# Patient Record
Sex: Female | Born: 1955 | Race: Black or African American | Hispanic: No | Marital: Married | Smoking: Never smoker
Health system: Southern US, Community
[De-identification: ages and names within clinical notes are randomized; demographics above are authoritative.]

## PROBLEM LIST (undated history)

## (undated) DIAGNOSIS — I10 Essential (primary) hypertension: Secondary | ICD-10-CM

## (undated) DIAGNOSIS — Z86010 Personal history of colonic polyps: Secondary | ICD-10-CM

## (undated) DIAGNOSIS — T8859XA Other complications of anesthesia, initial encounter: Secondary | ICD-10-CM

## (undated) DIAGNOSIS — E119 Type 2 diabetes mellitus without complications: Secondary | ICD-10-CM

## (undated) DIAGNOSIS — R202 Paresthesia of skin: Secondary | ICD-10-CM

## (undated) DIAGNOSIS — C50919 Malignant neoplasm of unspecified site of unspecified female breast: Secondary | ICD-10-CM

## (undated) DIAGNOSIS — E1121 Type 2 diabetes mellitus with diabetic nephropathy: Secondary | ICD-10-CM

## (undated) DIAGNOSIS — N189 Chronic kidney disease, unspecified: Secondary | ICD-10-CM

## (undated) DIAGNOSIS — E876 Hypokalemia: Secondary | ICD-10-CM

## (undated) DIAGNOSIS — M109 Gout, unspecified: Secondary | ICD-10-CM

## (undated) DIAGNOSIS — C801 Malignant (primary) neoplasm, unspecified: Secondary | ICD-10-CM

## (undated) DIAGNOSIS — T4145XA Adverse effect of unspecified anesthetic, initial encounter: Secondary | ICD-10-CM

## (undated) DIAGNOSIS — Z860101 Personal history of adenomatous and serrated colon polyps: Secondary | ICD-10-CM

## (undated) DIAGNOSIS — E785 Hyperlipidemia, unspecified: Secondary | ICD-10-CM

## (undated) HISTORY — DX: Essential (primary) hypertension: I10

## (undated) HISTORY — DX: Personal history of adenomatous and serrated colon polyps: Z86.0101

## (undated) HISTORY — DX: Type 2 diabetes mellitus with diabetic nephropathy: E11.21

## (undated) HISTORY — DX: Personal history of colonic polyps: Z86.010

## (undated) HISTORY — PX: ABDOMINAL HYSTERECTOMY: SHX81

## (undated) HISTORY — DX: Gout, unspecified: M10.9

## (undated) HISTORY — DX: Type 2 diabetes mellitus without complications: E11.9

## (undated) HISTORY — DX: Malignant (primary) neoplasm, unspecified: C80.1

## (undated) HISTORY — PX: BREAST EXCISIONAL BIOPSY: SUR124

## (undated) HISTORY — DX: Hyperlipidemia, unspecified: E78.5

## (undated) HISTORY — DX: Paresthesia of skin: R20.2

---

## 1993-10-09 DIAGNOSIS — C801 Malignant (primary) neoplasm, unspecified: Secondary | ICD-10-CM

## 1993-10-09 HISTORY — PX: BREAST SURGERY: SHX581

## 1993-10-09 HISTORY — DX: Malignant (primary) neoplasm, unspecified: C80.1

## 1998-01-05 ENCOUNTER — Ambulatory Visit (HOSPITAL_COMMUNITY): Admission: RE | Admit: 1998-01-05 | Discharge: 1998-01-05 | Payer: Self-pay | Admitting: Geriatric Medicine

## 1999-01-03 ENCOUNTER — Ambulatory Visit (HOSPITAL_COMMUNITY): Admission: RE | Admit: 1999-01-03 | Discharge: 1999-01-03 | Payer: Self-pay | Admitting: Geriatric Medicine

## 1999-01-18 ENCOUNTER — Ambulatory Visit (HOSPITAL_COMMUNITY): Admission: RE | Admit: 1999-01-18 | Discharge: 1999-01-18 | Payer: Self-pay | Admitting: *Deleted

## 2000-02-08 ENCOUNTER — Encounter: Admission: RE | Admit: 2000-02-08 | Discharge: 2000-02-08 | Payer: Self-pay | Admitting: Neurological Surgery

## 2000-02-08 ENCOUNTER — Encounter: Payer: Self-pay | Admitting: Neurological Surgery

## 2000-10-09 DIAGNOSIS — C50919 Malignant neoplasm of unspecified site of unspecified female breast: Secondary | ICD-10-CM

## 2000-10-09 HISTORY — PX: MASTECTOMY: SHX3

## 2000-10-09 HISTORY — DX: Malignant neoplasm of unspecified site of unspecified female breast: C50.919

## 2001-03-12 ENCOUNTER — Encounter: Admission: RE | Admit: 2001-03-12 | Discharge: 2001-03-12 | Payer: Self-pay | Admitting: Geriatric Medicine

## 2001-03-12 ENCOUNTER — Encounter: Payer: Self-pay | Admitting: Geriatric Medicine

## 2001-03-27 ENCOUNTER — Ambulatory Visit (HOSPITAL_COMMUNITY): Admission: RE | Admit: 2001-03-27 | Discharge: 2001-03-27 | Payer: Self-pay | Admitting: Geriatric Medicine

## 2001-03-27 ENCOUNTER — Encounter: Payer: Self-pay | Admitting: Geriatric Medicine

## 2001-08-05 ENCOUNTER — Other Ambulatory Visit: Admission: RE | Admit: 2001-08-05 | Discharge: 2001-08-05 | Payer: Self-pay | Admitting: *Deleted

## 2001-08-27 ENCOUNTER — Ambulatory Visit: Admission: RE | Admit: 2001-08-27 | Discharge: 2001-08-27 | Payer: Self-pay | Admitting: Gynecology

## 2001-09-02 ENCOUNTER — Encounter: Payer: Self-pay | Admitting: Gynecology

## 2001-09-04 ENCOUNTER — Inpatient Hospital Stay (HOSPITAL_COMMUNITY): Admission: RE | Admit: 2001-09-04 | Discharge: 2001-09-10 | Payer: Self-pay | Admitting: *Deleted

## 2001-09-04 ENCOUNTER — Encounter (INDEPENDENT_AMBULATORY_CARE_PROVIDER_SITE_OTHER): Payer: Self-pay

## 2001-09-08 ENCOUNTER — Encounter: Payer: Self-pay | Admitting: *Deleted

## 2002-04-14 ENCOUNTER — Ambulatory Visit (HOSPITAL_COMMUNITY): Admission: RE | Admit: 2002-04-14 | Discharge: 2002-04-14 | Payer: Self-pay | Admitting: Geriatric Medicine

## 2002-04-14 ENCOUNTER — Encounter: Payer: Self-pay | Admitting: Geriatric Medicine

## 2002-10-21 ENCOUNTER — Other Ambulatory Visit: Admission: RE | Admit: 2002-10-21 | Discharge: 2002-10-21 | Payer: Self-pay | Admitting: Obstetrics & Gynecology

## 2003-11-05 ENCOUNTER — Ambulatory Visit (HOSPITAL_COMMUNITY): Admission: RE | Admit: 2003-11-05 | Discharge: 2003-11-05 | Payer: Self-pay | Admitting: Geriatric Medicine

## 2005-10-27 ENCOUNTER — Ambulatory Visit (HOSPITAL_COMMUNITY): Admission: RE | Admit: 2005-10-27 | Discharge: 2005-10-27 | Payer: Self-pay | Admitting: Urology

## 2005-11-15 ENCOUNTER — Encounter: Admission: RE | Admit: 2005-11-15 | Discharge: 2005-11-15 | Payer: Self-pay | Admitting: Geriatric Medicine

## 2006-02-07 ENCOUNTER — Encounter: Payer: Self-pay | Admitting: Geriatric Medicine

## 2007-03-11 ENCOUNTER — Ambulatory Visit (HOSPITAL_COMMUNITY): Admission: RE | Admit: 2007-03-11 | Discharge: 2007-03-11 | Payer: Self-pay | Admitting: Geriatric Medicine

## 2008-07-16 ENCOUNTER — Ambulatory Visit (HOSPITAL_COMMUNITY): Admission: RE | Admit: 2008-07-16 | Discharge: 2008-07-16 | Payer: Self-pay | Admitting: Geriatric Medicine

## 2009-08-04 ENCOUNTER — Ambulatory Visit (HOSPITAL_COMMUNITY): Admission: RE | Admit: 2009-08-04 | Discharge: 2009-08-04 | Payer: Self-pay | Admitting: Geriatric Medicine

## 2010-08-11 ENCOUNTER — Ambulatory Visit (HOSPITAL_COMMUNITY): Admission: RE | Admit: 2010-08-11 | Discharge: 2010-08-11 | Payer: Self-pay | Admitting: Geriatric Medicine

## 2011-02-24 NOTE — Discharge Summary (Signed)
[facility]  Patient:    Lindsay Quinn, Lindsay Quinn Visit Number: [id number] MRN: [id number]          Service Type: GYN Location: [place] 01 Attending Physician:  Alvino Chapel Dictated by:   Coralee Pesa Duanne Limerick, M.D. Admit Date:  09/04/2001 Discharge Date: 09/10/2001                             Discharge Summary  DISCHARGE DIAGNOSES:          1. Leiomyomata uteri--enlarging (submucosal,                                  intramural, and subserosal).                               2. Postoperative ileus.                               3. History of breast cancer.  OPERATIVE PROCEDURE:          1. Total abdominal hysterectomy by                                  Dr. Marti Sleigh, Dr. Micheal Likens                                  assistant.                               2. Exploratory laparotomy.  HISTORY OF PRESENT ILLNESS:   A 55 year old woman, with personal history of breast cancer, admitted for a total abdominal hysterectomy by Dr. Marti Sleigh for rapidly enlarging uterine fibroids.  Uterine size had changed from 8-10 weeks in 2001 to almost 20 weeks this year.  She has been asymptomatic.  She was admitted and taken to the operating room on September 04, 2001.  A total abdominal hysterectomy was performed by Dr. Marti Sleigh with Dr. Micheal Likens as assistant.  Final pathology revealed benign secretory endometrium, leiomyomata uteri (submucosal, intramural, and subserosal) associated with extensive degenerative change and cervix with squamous metaplasia.  Estimated blood loss was 150 cc.  The patients postoperative course was remarkable for a prolonged postoperative ileus, which occurred on postop day #2.  Several attempts at advancing diet were unsuccessful.  X-ray of abdomen revealed gas in the proximal and mid colon, a tiny amount of gas in the rectum, but air fluid levels in the loops of small bowel which were  mildly distended on September 08, 2001.  Potassium was repleted.  Intraoperative oversewing of a very small portion of small bowel serosa which had been blanched by cautery was discussed with the patient.  At no time did she appear to have a small bowel obstruction.  She remained n.p.o. from December 1 to December 3.  Diet was advanced without difficulty on postoperative day #6, December 3.  She was discharged home in satisfactory condition.  She was given routine status post laparotomy instructions.  She will return to the office for reevaluation in a week.  Staples were removed  prior to discharge.  DISCHARGE MEDICATIONS:        1. Hydrochlorothiazide as preop.                               2. Tylenol.                               3. Darvocet as needed.                               4. Vitamins.  Postoperative hemoglobin was 12.7. Dictated by:   Coralee Pesa Duanne Limerick, M.D. Attending Physician:  Alvino Chapel DD:  09/26/01 TD:  09/27/01 Job: [id number]

## 2011-02-24 NOTE — Discharge Summary (Signed)
NAMESHUNNA, BUSAM             ACCOUNT NO.:  [id number]   MEDICAL RECORD NO.:  [id number]          PATIENT TYPE:  INP   LOCATION:                                FACILITY:  [facility]   PHYSICIAN:  Thornell Sartorius, MD        DATE OF BIRTH:  [birth year]   DATE OF ADMISSION:  03/15/2007  DATE OF DISCHARGE:  03/16/2007                               DISCHARGE SUMMARY   PROCEDURE:  Total abdominal hysterectomy.   HISTORY:  This is a 55 year old African-American female G0, P0, who was  admitted for abdominal hysterectomy secondary to severe menorrhagia and  history of severe anemia.  Her LMP was the second week of May.  In the  past she has had a myomectomy for severe anemia to 3 and severe  menorrhagia. At her last office visit she said she had done well for 10  years, but in April of this year her hemoglobin was 5.6.  She was given  Lupron to see if we could build up her hemoglobin prior to her surgery.   PAST MEDICAL HISTORY:  Not significant other than for anemia.   PAST SURGICAL HISTORY:  Significant for myomectomy in 1997.   PAST OB/GYN HISTORY:  She is gravida 0 with limited care in the past  several years.  She has never had intercourse, has never been pregnant.   ALLERGIES:  No known drug allergies.   MEDICATIONS:  Iron.   SOCIAL HISTORY:  She denied tobacco or alcohol use.   FAMILY HISTORY:  Mother passed away with kidney problems.  Father is  healthy.   PHYSICAL EXAMINATION:  On admission she had a benign exam.   HOSPITAL COURSE:  She had an EBL of approximately 150 cc. There were no  complications.  Her postoperative course has been relatively  uncomplicated.  She has remained afebrile with stable vital signs  throughout.  She is able to walk, her pain is well controlled with p.o.  pain medicine and she is tolerating a diet.  Her hemoglobin decreased  from 10.7 to 9.3.  She was ready for discharge home on postoperative day  2 with routine discharge instructions and a  prescription for Motrin and  Percocet.   She will follow up on Monday or Tuesday for stable removal. She is  advised to continue her iron.     Thornell Sartorius, MD  Electronically Signed    JB/MEDQ  D:  03/16/2007  T:  03/16/2007  Job:  [id number]

## 2011-02-24 NOTE — Op Note (Signed)
[place]  Patient:    Lindsay Quinn, Lindsay Quinn Visit Number: [id number] MRN: [id number]          Service Type: Attending:  Cherly Anderson. Clarke-Pearson, M.D. Dictated by:   Cherly Anderson. Clarke-Pearson, M.D. Proc. Date: 09/04/01   CC:         Coralee Pesa. Duanne Limerick, M.D.  Hal T. Stoneking, M.D.  Caswell Corwin, R.N.   Operative Report  PREOPERATIVE DIAGNOSES:       1. Rapidly-enlarging uterine fibroids.                               2. Ovarian cyst.  POSTOPERATIVE DIAGNOSES:      1. Uterine fibroids with necrosis.                               2. Resolving functional ovarian cyst.  PROCEDURES:                   1. Exploratory laparotomy.                               2. Total abdominal hysterectomy.  SURGEON:                      Daniel L. Clarke-Pearson, M.D.  ASSISTANTS:                   1. Coralee Pesa. Duanne Limerick, M.D.                               2. Caswell Corwin, R.N.  ANESTHESIA:                   General with orotracheal tube.  ESTIMATED BLOOD LOSS:         150 cc.  SURGICAL FINDINGS:            At exploratory laparotomy, the upper abdomen was normal.  The uterus was occupied by numerous myomas, some as large as 6 cm in diameter.  The uterine size in total was approximately 18-weeks gestational size.  The tubes and ovaries appeared normal except for a functional cyst. There were some adhesions of the tubes, ovaries and uterus to the sigmoid colon, which were easily lysed.  SURGICAL PROCEDURE:           The patient was brought to the operating room and, after satisfactory attainment of general anesthesia, was placed in the modified lithotomy position in [place].  The anterior abdominal wall, perineum and vagina were prepped with Betadine.  A Foley catheter was placed and the patient was draped.  The abdomen was entered through a midline incision.  Peritoneal washings were initially obtained.  The upper abdomen and pelvis were explored with the  above-noted findings.  A Bookwalter retractor was assembled and the uterus was palpated.  The midline incision was extended to just lateral to the umbilicus to gain adequate access to perform the surgery safely.  The left round ligament was divided and the retroperitoneal space opened, identifying the ureter.  The fallopian tube and uterine-ovarian anastomosis were isolated by developing a hole in the posterior aspect of the broad ligament.  This was cross clamped, divided, free tied and suture ligated using 0 Vicryl.  A similar  procedure was performed on the right side.  The bladder flap was incised and then advanced through sharp and blunt dissection. The uterine vessels were skeletonized, clamped, cut and suture ligated.  An additional clamp was taken lateral to the cervix just beneath the uterine vessels on both sides.  In order to gain adequate exposure to the cervix, a supracervical hysterectomy was performed, incising the uterus at its connection with the cervix.  The uterus was handed off the operative field and submitted for frozen section.  Frozen section report returned showing uterine myomas with necrosis.  No evidence of leiomyosarcoma was noted.  We then proceeded to remove the cervix.  Paracervical and cardinal ligaments were clamped, cut and suture ligated.  The rectovaginal septum was developed and the vaginal angles were cross clamped and divided and the vagina transected from its connection to the cervix.  The vaginal angles were transfixed and the central portion of the vagina closed with interrupted figure-of-eight sutures of 0 Vicryl.  The pelvis was reinspected and found to be hemostatic.  The ovaries were sutured to the lateral pelvic side wall so as to keep them from falling deep into the pelvis.  The abdomen and pelvis were irrigated with saline.  The retractors and packs were removed and the anterior abdominal wall closed in layers, the first being a running mass  closure using #1 PDS.  The subcutaneous tissue was irrigated.  Marcaine 0.25% was injected into the skin and then the skin closed with skin staples.  A dressing was applied.  The patient was awakened from anesthesia and taken to the recovery room in satisfactory condition.  Sponge, needle and instrument counts were correct x 2. Dictated by:   Cherly Anderson. Clarke-Pearson, M.D. Attending:  Cherly Anderson. Clarke-Pearson, M.D. DD:  09/04/01 TD:  09/04/01 Job: [id number]

## 2011-02-24 NOTE — Consult Note (Signed)
[facility]  Patient:    Lindsay Quinn, Lindsay Quinn Visit Number: [id number] MRN: [id number]          Service Type: GON Location: GYN Attending Physician:  Alvino Chapel Dictated by:   Cherly Anderson Clarke-Pearson, M.D. Proc. Date: 08/27/01 Admit Date:  08/27/2001   CC:         Coralee Pesa. Duanne Limerick, M.D.  Caswell Corwin, R.N.  Hal T. Stoneking, M.D.   Consultation Report  NEW PATIENT EVALUATION  REASON FOR CONSULTATION:  [age]-year-old African-American female referred by Dr. Coralee Pesa. Duanne Limerick for management of rapidly progressive uterine fibroids. The patient has had known fibroids over the years and has been followed by Dr. Duanne Limerick.  In June of 2002, patients internist, Dr. Christiane Ha T. Stoneking, noted a lower abdominal mass and a CT scan was obtained revealing a large uterus. She was subsequently seen by Dr. Duanne Limerick and it was noted that her uterine size had been approximately [redacted] weeks gestational size in October of 2001.  Further evaluation by ultrasound showed multiple fibroids which had increased somewhat since an ultrasound in 2000.  The patient returned for her annual exam to see Dr. Duanne Limerick and it was felt that the uterus was approximately 19 cm, where three months previously it was only 13 cm.  The patient also has a 4.6 x 3.3 x 4.7-cm left ovarian cyst which is simple.  Surprisingly, the patient is relatively asymptomatic.  She has regular cyclic menstrual periods. She denies any significant pelvic pain or pressure.  PAST MEDICAL HISTORY:  Medical illnesses:  Breast cancer in 1995, status post mastectomy and chemotherapy.  The patient has never received any tamoxifen. Hypertension.  DRUG ALLERGIES:  None.  CURRENT MEDICATIONS:  Diuretic (unknown type).  It is also noted the patient has apparently been informed that she should take prophylactic antibiotics before any invasive procedure secondary to a mitral valve prolapse.  OBSTETRICAL  HISTORY:  Gravida 0.  SOCIAL HISTORY:  The patient is married.  Her husband is an Optometrist.  She works as a Geologist, engineering.  REVIEW OF SYSTEMS:  Otherwise negative.  PHYSICAL EXAMINATION:  VITAL SIGNS:  Weight 165 pounds.  Blood pressure 152/96, pulse 86.  GENERAL:  The patient is a healthy African-American female in no acute distress.  HEENT:  Negative.  NECK:  Supple without thyromegaly.  NODES:  There is no supraclavicular or inguinal adenopathy.  ABDOMEN:  Soft and nontender.  No masses, organomegaly, ascites or herniae are noted except for a mass palpable up to the umbilicus in the lower abdomen consistent with uterine fibroids.  PELVIC:  EGBUS normal.  Vagina is clean and well-supported.  Cervix is normal. Uterus is enlarged and irregular with a prominent irregularity to the left. In aggregate, this extends to the umbilicus and is approximately 19 to [redacted] weeks gestational size.  Rectovaginal exam confirms.  IMPRESSION:  Rapidly enlarging uterine fibroids, rule out leiomyosarcoma.  PLAN:  I had a lengthy discussion with the patient and her husband regarding our recommendation that she undergo a total abdominal hysterectomy.  The pros and cons of preserving the ovaries were discussed.  In general, the patient would prefer to preserve her ovaries for hormonal function unless there is convincing evidence that oophorectomy might positively impact her history of breast cancer.  We will contact Dr. Haynes Bast Murinson to discuss this particular issue.  The patient also questioned whether myomectomy might be feasible and I indicated that myomectomy was really used in a setting where preservation of  fertility was the primary consideration.  At the end of the discussion, the patient and her husband have agreed to proceed with total abdominal hysterectomy, which is scheduled for November 27th.  They are aware of the risks of surgery including hemorrhage or infection, injury  to adjacent viscera and thromboembolic complications.  They are also aware of anesthetic risks.  I have informed them that given the size of the mass, the abdominal exploration will be carried out through a midline incision and they are fine with this approach.  We will use prophylactic antibiotics perioperatively for cardiac prophylaxis. Dictated by:   Cherly Anderson. Clarke-Pearson, M.D. Attending Physician:  Alvino Chapel DD:  08/27/01 TD:  08/28/01 Job: [id number]

## 2011-07-24 ENCOUNTER — Other Ambulatory Visit (HOSPITAL_COMMUNITY): Payer: Self-pay | Admitting: Geriatric Medicine

## 2011-07-24 DIAGNOSIS — Z1231 Encounter for screening mammogram for malignant neoplasm of breast: Secondary | ICD-10-CM

## 2011-08-17 ENCOUNTER — Ambulatory Visit (HOSPITAL_COMMUNITY): Payer: Managed Care, Other (non HMO)

## 2011-08-18 ENCOUNTER — Ambulatory Visit (HOSPITAL_COMMUNITY)
Admission: RE | Admit: 2011-08-18 | Discharge: 2011-08-18 | Disposition: A | Discharge status: Home / Self Care | Payer: Managed Care, Other (non HMO) | Source: Ambulatory Visit | Attending: Geriatric Medicine | Admitting: Geriatric Medicine

## 2011-08-18 DIAGNOSIS — Z1231 Encounter for screening mammogram for malignant neoplasm of breast: Secondary | ICD-10-CM | POA: Insufficient documentation

## 2012-09-11 ENCOUNTER — Other Ambulatory Visit (HOSPITAL_COMMUNITY): Payer: Self-pay | Admitting: Geriatric Medicine

## 2012-09-11 DIAGNOSIS — Z1231 Encounter for screening mammogram for malignant neoplasm of breast: Secondary | ICD-10-CM

## 2012-10-04 ENCOUNTER — Ambulatory Visit (HOSPITAL_COMMUNITY)
Admission: RE | Admit: 2012-10-04 | Discharge: 2012-10-04 | Disposition: A | Discharge status: Home / Self Care | Payer: Managed Care, Other (non HMO) | Source: Ambulatory Visit | Attending: Geriatric Medicine | Admitting: Geriatric Medicine

## 2012-10-04 ENCOUNTER — Other Ambulatory Visit (HOSPITAL_COMMUNITY): Payer: Self-pay | Admitting: Geriatric Medicine

## 2012-10-04 DIAGNOSIS — Z1231 Encounter for screening mammogram for malignant neoplasm of breast: Secondary | ICD-10-CM

## 2013-03-13 ENCOUNTER — Encounter: Payer: Self-pay | Admitting: *Deleted

## 2013-03-13 ENCOUNTER — Encounter: Payer: Managed Care, Other (non HMO) | Attending: Geriatric Medicine | Admitting: *Deleted

## 2013-03-13 VITALS — Ht 66.0 in | Wt 165.0 lb

## 2013-03-13 DIAGNOSIS — Z713 Dietary counseling and surveillance: Secondary | ICD-10-CM | POA: Insufficient documentation

## 2013-03-13 DIAGNOSIS — E119 Type 2 diabetes mellitus without complications: Secondary | ICD-10-CM

## 2013-03-13 NOTE — Progress Notes (Signed)
  Patient was seen on 03/12/2013 for the first of a series of three diabetes self-management courses at the [facility]. The following learning objectives were met by the patient during this course:   Defines the role of glucose and insulin  Identifies type of diabetes and pathophysiology  Defines the diagnostic criteria for diabetes and prediabetes  States the risk factors for Type 2 Diabetes  States the symptoms of Type 2 Diabetes  Defines Type 2 Diabetes treatment goals  Defines Type 2 Diabetes treatment options  States the rationale for glucose monitoring  Identifies A1C, glucose targets, and testing times  Identifies proper sharps disposal  Defines the purpose of a diabetes food plan  Identifies carbohydrate food groups  Defines effects of carbohydrate foods on glucose levels  Identifies carbohydrate choices/grams/food labels  States benefits of physical activity and effect on glucose  Review of suggested activity guidelines  Handouts given during class include:  Type 2 Diabetes: Basics Book  My [place] and Activity Log  Follow-Up Plan: Attend core 2 and 3 classes.

## 2013-03-13 NOTE — Patient Instructions (Signed)
Goals:  Follow Diabetes Meal Plan as instructed  Eat 3 meals and 2 snacks, every 3-5 hrs  Limit carbohydrate intake to 30-45 grams carbohydrate/meal  Limit carbohydrate intake to 15 grams carbohydrate/snack  Add lean protein foods to meals/snacks  Monitor glucose levels as instructed by your doctor  Aim for 30 mins of physical activity daily  Bring food record and glucose log to your next nutrition visit 

## 2013-03-18 ENCOUNTER — Encounter: Payer: Managed Care, Other (non HMO) | Admitting: *Deleted

## 2013-03-18 DIAGNOSIS — E119 Type 2 diabetes mellitus without complications: Secondary | ICD-10-CM

## 2013-03-19 NOTE — Progress Notes (Signed)
  Patient was seen on 03/18/2013 for the second of a series of three diabetes self-management courses at the [facility]. The following learning objectives were met by the patient during this course:   Explain basic nutrition maintenance and quality assurance  Describe causes, symptoms and treatment of hypoglycemia and hyperglycemia  Explain how to manage diabetes during illness  Describe the importance of good nutrition for health and healthy eating strategies  List strategies to follow meal plan when dining out  Describe the effects of alcohol on glucose and how to use it safely  Describe problem solving skills for day-to-day glucose challenges  Describe strategies to use when treatment plan needs to change  Identify important factors involved in successful weight loss  Describe ways to remain physically active  Describe the impact of regular activity on insulin resistance    Handouts given in class:  Refrigerator magnet for Sick Day Guidelines  [facility] Oral medication/insulin handout  Follow-Up Plan: Patient will attend the final class of the ADA Diabetes Self-Care Education.

## 2013-03-20 ENCOUNTER — Encounter: Payer: Managed Care, Other (non HMO) | Admitting: *Deleted

## 2013-03-20 DIAGNOSIS — E119 Type 2 diabetes mellitus without complications: Secondary | ICD-10-CM

## 2013-03-20 NOTE — Progress Notes (Signed)
  Patient was seen on 03/20/2013 for the third of a series of three diabetes self-management courses at the [facility]. The following learning objectives were met by the patient during this course:    Describe how diabetes changes over time   Identify diabetes complications and ways to prevent them   Describe strategies that can promote heart health including lowering blood pressure and cholesterol   Describe strategies to lower dietary fat and sodium in the diet   Identify physical activities that benefit cardiovascular health   Evaluate success in meeting personal goal   Describe the belief that they can live successfully with diabetes day to day   Establish 2-3 goals that they will plan to diligently work on until they return for the free 39-month follow-up visit  The following handouts were given in class:  3 Month Follow Up Visit handout  Goal setting handout  Class evaluation form  Your patient has established the following 3 month goals for diabetes self-care:  Count carbohydrates at most meals and snacks   Increase activity to 5 days a week   Look for patterns in log book 4 days a month   Test glucose 2 times a day, 7 days a week   Follow-Up Plan: Patient will attend a 3 month follow-up visit for diabetes self-management education.

## 2013-07-18 ENCOUNTER — Ambulatory Visit: Payer: Managed Care, Other (non HMO) | Admitting: *Deleted

## 2013-08-07 ENCOUNTER — Ambulatory Visit: Payer: Managed Care, Other (non HMO) | Admitting: *Deleted

## 2013-08-20 ENCOUNTER — Ambulatory Visit: Payer: Managed Care, Other (non HMO) | Admitting: *Deleted

## 2013-12-19 ENCOUNTER — Other Ambulatory Visit (HOSPITAL_COMMUNITY): Payer: Self-pay | Admitting: Geriatric Medicine

## 2013-12-19 DIAGNOSIS — Z1231 Encounter for screening mammogram for malignant neoplasm of breast: Secondary | ICD-10-CM

## 2014-01-05 ENCOUNTER — Ambulatory Visit (HOSPITAL_COMMUNITY)
Admission: RE | Admit: 2014-01-05 | Discharge: 2014-01-05 | Disposition: A | Discharge status: Home / Self Care | Payer: Managed Care, Other (non HMO) | Source: Ambulatory Visit | Attending: Geriatric Medicine | Admitting: Geriatric Medicine

## 2014-01-05 ENCOUNTER — Ambulatory Visit (HOSPITAL_COMMUNITY): Payer: Managed Care, Other (non HMO)

## 2014-01-05 DIAGNOSIS — Z1231 Encounter for screening mammogram for malignant neoplasm of breast: Secondary | ICD-10-CM

## 2015-01-05 ENCOUNTER — Other Ambulatory Visit (HOSPITAL_COMMUNITY): Payer: Self-pay | Admitting: Geriatric Medicine

## 2015-01-05 DIAGNOSIS — Z1231 Encounter for screening mammogram for malignant neoplasm of breast: Secondary | ICD-10-CM

## 2015-01-07 ENCOUNTER — Other Ambulatory Visit (HOSPITAL_COMMUNITY): Payer: Self-pay | Admitting: Geriatric Medicine

## 2015-01-07 ENCOUNTER — Ambulatory Visit (HOSPITAL_COMMUNITY)
Admission: RE | Admit: 2015-01-07 | Discharge: 2015-01-07 | Disposition: A | Discharge status: Home / Self Care | Payer: Managed Care, Other (non HMO) | Source: Ambulatory Visit | Attending: Geriatric Medicine | Admitting: Geriatric Medicine

## 2015-01-07 DIAGNOSIS — Z1231 Encounter for screening mammogram for malignant neoplasm of breast: Secondary | ICD-10-CM

## 2016-01-11 ENCOUNTER — Other Ambulatory Visit: Payer: Self-pay

## 2016-01-11 DIAGNOSIS — Z1231 Encounter for screening mammogram for malignant neoplasm of breast: Secondary | ICD-10-CM

## 2016-01-25 ENCOUNTER — Ambulatory Visit: Payer: Managed Care, Other (non HMO)

## 2016-03-09 ENCOUNTER — Other Ambulatory Visit: Payer: Self-pay | Admitting: Gastroenterology

## 2016-03-29 ENCOUNTER — Ambulatory Visit: Payer: Managed Care, Other (non HMO)

## 2016-04-05 ENCOUNTER — Ambulatory Visit
Admission: RE | Admit: 2016-04-05 | Discharge: 2016-04-05 | Disposition: A | Discharge status: Home / Self Care | Payer: Managed Care, Other (non HMO) | Source: Ambulatory Visit

## 2016-04-05 DIAGNOSIS — Z1231 Encounter for screening mammogram for malignant neoplasm of breast: Secondary | ICD-10-CM

## 2016-04-10 ENCOUNTER — Other Ambulatory Visit: Payer: Self-pay | Admitting: Obstetrics & Gynecology

## 2016-04-10 DIAGNOSIS — R928 Other abnormal and inconclusive findings on diagnostic imaging of breast: Secondary | ICD-10-CM

## 2016-04-19 ENCOUNTER — Ambulatory Visit
Admission: RE | Admit: 2016-04-19 | Discharge: 2016-04-19 | Disposition: A | Discharge status: Home / Self Care | Payer: Managed Care, Other (non HMO) | Source: Ambulatory Visit | Attending: Obstetrics & Gynecology | Admitting: Obstetrics & Gynecology

## 2016-04-19 ENCOUNTER — Other Ambulatory Visit: Payer: Self-pay | Admitting: Obstetrics & Gynecology

## 2016-04-19 DIAGNOSIS — R928 Other abnormal and inconclusive findings on diagnostic imaging of breast: Secondary | ICD-10-CM

## 2016-04-26 ENCOUNTER — Other Ambulatory Visit: Payer: Self-pay | Admitting: Obstetrics & Gynecology

## 2016-04-26 ENCOUNTER — Inpatient Hospital Stay: Admission: RE | Admit: 2016-04-26 | Payer: Managed Care, Other (non HMO) | Source: Ambulatory Visit

## 2016-04-26 ENCOUNTER — Ambulatory Visit
Admission: RE | Admit: 2016-04-26 | Discharge: 2016-04-26 | Disposition: A | Discharge status: Home / Self Care | Payer: Managed Care, Other (non HMO) | Source: Ambulatory Visit | Attending: Obstetrics & Gynecology | Admitting: Obstetrics & Gynecology

## 2016-04-26 ENCOUNTER — Other Ambulatory Visit: Payer: Managed Care, Other (non HMO)

## 2016-04-26 DIAGNOSIS — R928 Other abnormal and inconclusive findings on diagnostic imaging of breast: Secondary | ICD-10-CM

## 2016-04-26 DIAGNOSIS — N6310 Unspecified lump in the right breast, unspecified quadrant: Secondary | ICD-10-CM

## 2016-05-04 ENCOUNTER — Encounter (HOSPITAL_COMMUNITY): Payer: Self-pay | Admitting: *Deleted

## 2016-05-08 ENCOUNTER — Ambulatory Visit (HOSPITAL_COMMUNITY): Payer: Managed Care, Other (non HMO) | Admitting: Anesthesiology

## 2016-05-08 ENCOUNTER — Encounter (HOSPITAL_COMMUNITY)
Admission: RE | Disposition: A | Discharge status: Home / Self Care | Payer: Self-pay | Source: Ambulatory Visit | Attending: Gastroenterology

## 2016-05-08 ENCOUNTER — Encounter (HOSPITAL_COMMUNITY): Payer: Self-pay

## 2016-05-08 ENCOUNTER — Ambulatory Visit (HOSPITAL_COMMUNITY)
Admission: RE | Admit: 2016-05-08 | Discharge: 2016-05-08 | Disposition: A | Discharge status: Home / Self Care | Payer: Managed Care, Other (non HMO) | Source: Ambulatory Visit | Attending: Gastroenterology | Admitting: Gastroenterology

## 2016-05-08 DIAGNOSIS — Z79899 Other long term (current) drug therapy: Secondary | ICD-10-CM | POA: Diagnosis not present

## 2016-05-08 DIAGNOSIS — Z7982 Long term (current) use of aspirin: Secondary | ICD-10-CM | POA: Insufficient documentation

## 2016-05-08 DIAGNOSIS — E119 Type 2 diabetes mellitus without complications: Secondary | ICD-10-CM | POA: Diagnosis not present

## 2016-05-08 DIAGNOSIS — I340 Nonrheumatic mitral (valve) insufficiency: Secondary | ICD-10-CM | POA: Diagnosis not present

## 2016-05-08 DIAGNOSIS — I10 Essential (primary) hypertension: Secondary | ICD-10-CM | POA: Insufficient documentation

## 2016-05-08 DIAGNOSIS — E7800 Pure hypercholesterolemia, unspecified: Secondary | ICD-10-CM | POA: Insufficient documentation

## 2016-05-08 DIAGNOSIS — Z7984 Long term (current) use of oral hypoglycemic drugs: Secondary | ICD-10-CM | POA: Diagnosis not present

## 2016-05-08 DIAGNOSIS — Z86010 Personal history of colonic polyps: Secondary | ICD-10-CM | POA: Diagnosis not present

## 2016-05-08 DIAGNOSIS — Z1211 Encounter for screening for malignant neoplasm of colon: Secondary | ICD-10-CM | POA: Diagnosis not present

## 2016-05-08 HISTORY — DX: Chronic kidney disease, unspecified: N18.9

## 2016-05-08 HISTORY — DX: Hypokalemia: E87.6

## 2016-05-08 HISTORY — PX: COLONOSCOPY WITH PROPOFOL: SHX5780

## 2016-05-08 HISTORY — DX: Adverse effect of unspecified anesthetic, initial encounter: T41.45XA

## 2016-05-08 HISTORY — DX: Other complications of anesthesia, initial encounter: T88.59XA

## 2016-05-08 LAB — GLUCOSE, CAPILLARY: Glucose-Capillary: 86 mg/dL (ref 65–99)

## 2016-05-08 SURGERY — COLONOSCOPY WITH PROPOFOL
Anesthesia: Monitor Anesthesia Care

## 2016-05-08 MED ORDER — PROPOFOL 10 MG/ML IV BOLUS
INTRAVENOUS | Status: DC | PRN
  Administered 2016-05-08 (×2): 20 mg via INTRAVENOUS
  Administered 2016-05-08: 30 mg via INTRAVENOUS

## 2016-05-08 MED ORDER — LIDOCAINE HCL (CARDIAC) 20 MG/ML IV SOLN
INTRAVENOUS | Status: AC
  Filled 2016-05-08: qty 5

## 2016-05-08 MED ORDER — LACTATED RINGERS IV SOLN
INTRAVENOUS | Status: DC
  Administered 2016-05-08: 11:00:00 via INTRAVENOUS

## 2016-05-08 MED ORDER — PROPOFOL 500 MG/50ML IV EMUL
INTRAVENOUS | Status: DC | PRN
  Administered 2016-05-08: 150 ug/kg/min via INTRAVENOUS

## 2016-05-08 MED ORDER — PROPOFOL 10 MG/ML IV BOLUS
INTRAVENOUS | Status: AC
  Filled 2016-05-08: qty 60

## 2016-05-08 MED ORDER — LIDOCAINE HCL (CARDIAC) 20 MG/ML IV SOLN
INTRAVENOUS | Status: DC | PRN
  Administered 2016-05-08: 25 mg via INTRATRACHEAL

## 2016-05-08 MED ORDER — SODIUM CHLORIDE 0.9 % IV SOLN: INTRAVENOUS | Status: DC

## 2016-05-08 SURGICAL SUPPLY — 22 items

## 2016-05-08 NOTE — Op Note (Signed)
[facility] Patient Name: Lindsay Quinn Procedure Date: 05/08/2016 MRN: [id number] Attending MD: Garlan Fair , MD Date of Birth: 1956/06/28 CSN: [id number] Age: 60 Admit Type: Outpatient Procedure:                Colonoscopy Indications:              High risk colon cancer surveillance: Personal                            history of non-advanced adenoma Providers:                Garlan Fair, MD, Laverta Baltimore RN, RN, Alfonso Patten, Technician, Dione Booze, CRNA Referring MD:              Medicines:                Propofol per Anesthesia Complications:            No immediate complications. Estimated Blood Loss:     Estimated blood loss: none. Procedure:                Pre-Anesthesia Assessment:                           - Prior to the procedure, a History and Physical                            was performed, and patient medications and                            allergies were reviewed. The patient's tolerance of                            previous anesthesia was also reviewed. The risks                            and benefits of the procedure and the sedation                            options and risks were discussed with the patient.                            All questions were answered, and informed consent                            was obtained. Prior Anticoagulants: The patient has                            taken aspirin, last dose was 1 day prior to                            procedure. ASA Grade Assessment: II - A patient  with mild systemic disease. After reviewing the                            risks and benefits, the patient was deemed in                            satisfactory condition to undergo the procedure.                           After obtaining informed consent, the colonoscope                            was passed under direct vision. Throughout the                             procedure, the patient's blood pressure, pulse, and                            oxygen saturations were monitored continuously. The                            [id number]) scope was introduced through                            the anus and advanced to the the cecum, identified                            by appendiceal orifice and ileocecal valve. The                            colonoscopy was somewhat difficult due to                            significant looping. The patient tolerated the                            procedure well. The quality of the bowel                            preparation was good. The appendiceal orifice and                            the rectum were photographed. Scope In: 10:44:59 AM Scope Out: 11:03:57 AM Scope Withdrawal Time: 0 hours 12 minutes 57 seconds  Total Procedure Duration: 0 hours 18 minutes 58 seconds  Findings:      The perianal and digital rectal examinations were normal.      The entire examined colon appeared normal. Impression:               - The entire examined colon is normal.                           - No specimens collected. Moderate Sedation:      N/A- Per Anesthesia Care Recommendation:           -  Patient has a contact number available for                            emergencies. The signs and symptoms of potential                            delayed complications were discussed with the                            patient. Return to normal activities tomorrow.                            Written discharge instructions were provided to the                            patient.                           - Repeat colonoscopy in 5 years for surveillance.                           - Resume previous diet.                           - Continue present medications. Procedure Code(s):        --- Professional ---                           [id number], Colorectal cancer screening; colonoscopy on                            individual at high  risk Diagnosis Code(s):        --- Professional ---                           Z86.010, Personal history of colonic polyps CPT copyright 2016 American Medical Association. All rights reserved. The codes documented in this report are preliminary and upon coder review may  be revised to meet current compliance requirements. Earle Gell, MD Garlan Fair, MD 05/08/2016 11:10:00 AM This report has been signed electronically. Number of Addenda: 0

## 2016-05-08 NOTE — H&P (Signed)
  Procedure: Surveillance colonoscopy. 02/27/2011 colonoscopy with removal of three small adenomatous polyps was performed  History: The patient is a 60 year old female born 09-Feb-1956. She is scheduled to undergo a surveillance colonoscopy today.  Past medical history: Hypercholesterolemia. Hypertension. Fibrocystic breast disease. Mitral valve regurgitation with prolapse. Breast cancer. Meningioma. Hysterectomy for uterine fibroids. Type 2 diabetes mellitus. Meningioma surgery performed in 1996. Left mastectomy performed in 1995. Total abdominal hysterectomy with bilateral salpingo-oophorectomy.  Medication allergies: Clindamycin caused rash  Exam: The patient is alert and lying comfortably on the endoscopy stretcher. Abdomen is soft and nontender to palpation. Lungs are clear to auscultation. Cardiac exam reveals a regular rhythm.  Plan: Proceed with surveillance colonoscopy

## 2016-05-08 NOTE — Discharge Instructions (Signed)

## 2016-05-08 NOTE — Anesthesia Preprocedure Evaluation (Signed)
Anesthesia Evaluation  Patient identified by MRN, date of birth, ID band Patient awake    Reviewed: Allergy & Precautions, H&P , Patient's Chart, lab work & pertinent test results, reviewed documented beta blocker date and time   Airway Mallampati: II  TM Distance: >3 FB Neck ROM: full    Dental no notable dental hx.    Pulmonary    Pulmonary exam normal breath sounds clear to auscultation       Cardiovascular hypertension, On Medications  Rhythm:regular Rate:Normal     Neuro/Psych    GI/Hepatic   Endo/Other  diabetes  Renal/GU      Musculoskeletal   Abdominal   Peds  Hematology   Anesthesia Other Findings   Reproductive/Obstetrics                             Anesthesia Physical Anesthesia Plan  ASA: II  Anesthesia Plan: MAC   Post-op Pain Management:    Induction: Intravenous  Airway Management Planned: Mask and Natural Airway  Additional Equipment:   Intra-op Plan:   Post-operative Plan:   Informed Consent: I have reviewed the patients History and Physical, chart, labs and discussed the procedure including the risks, benefits and alternatives for the proposed anesthesia with the patient or authorized representative who has indicated his/her understanding and acceptance.   Dental Advisory Given  Plan Discussed with: CRNA and Surgeon  Anesthesia Plan Comments: (Discussed sedation and potential to need to place airway or ETT if warranted by clinical changes intra-operatively. We will start procedure as MAC.)        Anesthesia Quick Evaluation

## 2016-05-08 NOTE — Transfer of Care (Signed)
Immediate Anesthesia Transfer of Care Note  Patient: Lindsay Quinn  Procedure(s) Performed: Procedure(s): COLONOSCOPY WITH PROPOFOL (N/A)  Patient Location: PACU and Endoscopy Unit  Anesthesia Type:MAC  Level of Consciousness: awake and patient cooperative  Airway & Oxygen Therapy: Patient Spontanous Breathing and Patient connected to face mask oxygen  Post-op Assessment: Report given to RN and Post -op Vital signs reviewed and stable  Post vital signs: Reviewed and stable  Last Vitals:  Vitals:   05/08/16 0859  BP: (!) 156/82  Pulse: 85  Resp: 12  Temp: 36.6 C    Last Pain:  Vitals:   05/08/16 0859  TempSrc: Oral         Complications: No apparent anesthesia complications

## 2016-05-08 NOTE — Anesthesia Postprocedure Evaluation (Signed)
Anesthesia Post Note  Patient: Lindsay Quinn  Procedure(s) Performed: Procedure(s) (LRB): COLONOSCOPY WITH PROPOFOL (N/A)  Patient location during evaluation: PACU Anesthesia Type: General Level of consciousness: awake and alert Pain management: pain level controlled Vital Signs Assessment: post-procedure vital signs reviewed and stable Respiratory status: spontaneous breathing, nonlabored ventilation, respiratory function stable and patient connected to nasal cannula oxygen Cardiovascular status: blood pressure returned to baseline and stable Postop Assessment: no signs of nausea or vomiting Anesthetic complications: no    Last Vitals:  Vitals:   05/08/16 1120 05/08/16 1130  BP: 119/61 (!) 143/75  Pulse: 70 75  Resp: 14 16  Temp:      Last Pain:  Vitals:   05/08/16 0859  TempSrc: Oral                 Rachele Lamaster EDWARD

## 2016-05-09 ENCOUNTER — Encounter (HOSPITAL_COMMUNITY): Payer: Self-pay | Admitting: Gastroenterology

## 2017-08-27 ENCOUNTER — Other Ambulatory Visit: Payer: Self-pay | Admitting: Geriatric Medicine

## 2017-08-27 DIAGNOSIS — R7402 Elevation of levels of lactic acid dehydrogenase (LDH): Secondary | ICD-10-CM

## 2017-08-27 DIAGNOSIS — R740 Nonspecific elevation of levels of transaminase and lactic acid dehydrogenase [LDH]: Principal | ICD-10-CM

## 2017-09-05 ENCOUNTER — Ambulatory Visit
Admission: RE | Admit: 2017-09-05 | Discharge: 2017-09-05 | Disposition: A | Discharge status: Home / Self Care | Payer: Managed Care, Other (non HMO) | Source: Ambulatory Visit | Attending: Geriatric Medicine | Admitting: Geriatric Medicine

## 2017-09-05 DIAGNOSIS — R740 Nonspecific elevation of levels of transaminase and lactic acid dehydrogenase [LDH]: Principal | ICD-10-CM

## 2017-09-05 DIAGNOSIS — R7402 Elevation of levels of lactic acid dehydrogenase (LDH): Secondary | ICD-10-CM

## 2017-09-07 ENCOUNTER — Other Ambulatory Visit: Payer: Self-pay | Admitting: Geriatric Medicine

## 2017-09-07 DIAGNOSIS — N63 Unspecified lump in unspecified breast: Secondary | ICD-10-CM

## 2017-09-13 ENCOUNTER — Other Ambulatory Visit: Payer: Self-pay | Admitting: Geriatric Medicine

## 2017-09-13 ENCOUNTER — Ambulatory Visit
Admission: RE | Admit: 2017-09-13 | Discharge: 2017-09-13 | Disposition: A | Discharge status: Home / Self Care | Payer: Managed Care, Other (non HMO) | Source: Ambulatory Visit | Attending: Geriatric Medicine | Admitting: Geriatric Medicine

## 2017-09-13 DIAGNOSIS — N63 Unspecified lump in unspecified breast: Secondary | ICD-10-CM

## 2017-09-13 HISTORY — DX: Malignant neoplasm of unspecified site of unspecified female breast: C50.919

## 2017-10-25 ENCOUNTER — Encounter: Payer: Self-pay | Admitting: Obstetrics & Gynecology

## 2017-10-25 ENCOUNTER — Ambulatory Visit: Payer: Managed Care, Other (non HMO) | Admitting: Obstetrics & Gynecology

## 2017-10-25 VITALS — BP 136/82 | Ht 66.0 in | Wt 182.0 lb

## 2017-10-25 DIAGNOSIS — Z90710 Acquired absence of both cervix and uterus: Secondary | ICD-10-CM

## 2017-10-25 DIAGNOSIS — Z853 Personal history of malignant neoplasm of breast: Secondary | ICD-10-CM

## 2017-10-25 DIAGNOSIS — Z780 Asymptomatic menopausal state: Secondary | ICD-10-CM | POA: Diagnosis not present

## 2017-10-25 DIAGNOSIS — Z01411 Encounter for gynecological examination (general) (routine) with abnormal findings: Secondary | ICD-10-CM | POA: Diagnosis not present

## 2017-10-25 DIAGNOSIS — Z13820 Encounter for screening for osteoporosis: Secondary | ICD-10-CM | POA: Diagnosis not present

## 2017-10-25 NOTE — Patient Instructions (Signed)
1. Encounter for gynecological examination with abnormal finding Gynecologic exam status post hysterectomy.  Pap reflex done on vaginal vault.  Right breast exam normal.  Status post left mastectomy.  Will repeat right screening mammogram July 2019.  Colonoscopy 2017, on a 5-year schedule.  Health labs with family physician.  2. Menopause present Well on no hormone replacement therapy.  3. History of left breast cancer Status post left mastectomy.  4. H/O total hysterectomy  5. Screening for osteoporosis Vitamin D supplements, calcium rich nutrition and regular weightbearing physical activity recommended.  Schedule bone density here. - DG Bone Density; Future  Lindsay Quinn, it was a pleasure seeing you today!  I will inform you of your results as soon as they are available.   Health Maintenance for Postmenopausal Women Menopause is a normal process in which your reproductive ability comes to an end. This process happens gradually over a span of months to years, usually between the ages of 3 and 41. Menopause is complete when you have missed 12 consecutive menstrual periods. It is important to talk with your health care provider about some of the most common conditions that affect postmenopausal women, such as heart disease, cancer, and bone loss (osteoporosis). Adopting a healthy lifestyle and getting preventive care can help to promote your health and wellness. Those actions can also lower your chances of developing some of these common conditions. What should I know about menopause? During menopause, you may experience a number of symptoms, such as:  Moderate-to-severe hot flashes.  Night sweats.  Decrease in sex drive.  Mood swings.  Headaches.  Tiredness.  Irritability.  Memory problems.  Insomnia.  Choosing to treat or not to treat menopausal changes is an individual decision that you make with your health care provider. What should I know about hormone replacement  therapy and supplements? Hormone therapy products are effective for treating symptoms that are associated with menopause, such as hot flashes and night sweats. Hormone replacement carries certain risks, especially as you become older. If you are thinking about using estrogen or estrogen with progestin treatments, discuss the benefits and risks with your health care provider. What should I know about heart disease and stroke? Heart disease, heart attack, and stroke become more likely as you age. This may be due, in part, to the hormonal changes that your body experiences during menopause. These can affect how your body processes dietary fats, triglycerides, and cholesterol. Heart attack and stroke are both medical emergencies. There are many things that you can do to help prevent heart disease and stroke:  Have your blood pressure checked at least every 1-2 years. High blood pressure causes heart disease and increases the risk of stroke.  If you are 69-84 years old, ask your health care provider if you should take aspirin to prevent a heart attack or a stroke.  Do not use any tobacco products, including cigarettes, chewing tobacco, or electronic cigarettes. If you need help quitting, ask your health care provider.  It is important to eat a healthy diet and maintain a healthy weight. ? Be sure to include plenty of vegetables, fruits, low-fat dairy products, and lean protein. ? Avoid eating foods that are high in solid fats, added sugars, or salt (sodium).  Get regular exercise. This is one of the most important things that you can do for your health. ? Try to exercise for at least 150 minutes each week. The type of exercise that you do should increase your heart rate and make you sweat.  This is known as moderate-intensity exercise. ? Try to do strengthening exercises at least twice each week. Do these in addition to the moderate-intensity exercise.  Know your numbers.Ask your health care provider  to check your cholesterol and your blood glucose. Continue to have your blood tested as directed by your health care provider.  What should I know about cancer screening? There are several types of cancer. Take the following steps to reduce your risk and to catch any cancer development as early as possible. Breast Cancer  Practice breast self-awareness. ? This means understanding how your breasts normally appear and feel. ? It also means doing regular breast self-exams. Let your health care provider know about any changes, no matter how small.  If you are 74 or older, have a clinician do a breast exam (clinical breast exam or CBE) every year. Depending on your age, family history, and medical history, it may be recommended that you also have a yearly breast X-ray (mammogram).  If you have a family history of breast cancer, talk with your health care provider about genetic screening.  If you are at high risk for breast cancer, talk with your health care provider about having an MRI and a mammogram every year.  Breast cancer (BRCA) gene test is recommended for women who have family members with BRCA-related cancers. Results of the assessment will determine the need for genetic counseling and BRCA1 and for BRCA2 testing. BRCA-related cancers include these types: ? Breast. This occurs in males or females. ? Ovarian. ? Tubal. This may also be called fallopian tube cancer. ? Cancer of the abdominal or pelvic lining (peritoneal cancer). ? Prostate. ? Pancreatic.  Cervical, Uterine, and Ovarian Cancer Your health care provider may recommend that you be screened regularly for cancer of the pelvic organs. These include your ovaries, uterus, and vagina. This screening involves a pelvic exam, which includes checking for microscopic changes to the surface of your cervix (Pap test).  For women ages 21-65, health care providers may recommend a pelvic exam and a Pap test every three years. For women ages  80-65, they may recommend the Pap test and pelvic exam, combined with testing for human papilloma virus (HPV), every five years. Some types of HPV increase your risk of cervical cancer. Testing for HPV may also be done on women of any age who have unclear Pap test results.  Other health care providers may not recommend any screening for nonpregnant women who are considered low risk for pelvic cancer and have no symptoms. Ask your health care provider if a screening pelvic exam is right for you.  If you have had past treatment for cervical cancer or a condition that could lead to cancer, you need Pap tests and screening for cancer for at least 20 years after your treatment. If Pap tests have been discontinued for you, your risk factors (such as having a new sexual partner) need to be reassessed to determine if you should start having screenings again. Some women have medical problems that increase the chance of getting cervical cancer. In these cases, your health care provider may recommend that you have screening and Pap tests more often.  If you have a family history of uterine cancer or ovarian cancer, talk with your health care provider about genetic screening.  If you have vaginal bleeding after reaching menopause, tell your health care provider.  There are currently no reliable tests available to screen for ovarian cancer.  Lung Cancer Lung cancer screening is recommended  for adults 19-68 years old who are at high risk for lung cancer because of a history of smoking. A yearly low-dose CT scan of the lungs is recommended if you:  Currently smoke.  Have a history of at least 30 pack-years of smoking and you currently smoke or have quit within the past 15 years. A pack-year is smoking an average of one pack of cigarettes per day for one year.  Yearly screening should:  Continue until it has been 15 years since you quit.  Stop if you develop a health problem that would prevent you from having  lung cancer treatment.  Colorectal Cancer  This type of cancer can be detected and can often be prevented.  Routine colorectal cancer screening usually begins at age 39 and continues through age 71.  If you have risk factors for colon cancer, your health care provider may recommend that you be screened at an earlier age.  If you have a family history of colorectal cancer, talk with your health care provider about genetic screening.  Your health care provider may also recommend using home test kits to check for hidden blood in your stool.  A small camera at the end of a tube can be used to examine your colon directly (sigmoidoscopy or colonoscopy). This is done to check for the earliest forms of colorectal cancer.  Direct examination of the colon should be repeated every 5-10 years until age 64. However, if early forms of precancerous polyps or small growths are found or if you have a family history or genetic risk for colorectal cancer, you may need to be screened more often.  Skin Cancer  Check your skin from head to toe regularly.  Monitor any moles. Be sure to tell your health care provider: ? About any new moles or changes in moles, especially if there is a change in a mole's shape or color. ? If you have a mole that is larger than the size of a pencil eraser.  If any of your family members has a history of skin cancer, especially at a young age, talk with your health care provider about genetic screening.  Always use sunscreen. Apply sunscreen liberally and repeatedly throughout the day.  Whenever you are outside, protect yourself by wearing long sleeves, pants, a wide-brimmed hat, and sunglasses.  What should I know about osteoporosis? Osteoporosis is a condition in which bone destruction happens more quickly than new bone creation. After menopause, you may be at an increased risk for osteoporosis. To help prevent osteoporosis or the bone fractures that can happen because of  osteoporosis, the following is recommended:  If you are 73-65 years old, get at least 1,000 mg of calcium and at least 600 mg of vitamin D per day.  If you are older than age 24 but younger than age 75, get at least 1,200 mg of calcium and at least 600 mg of vitamin D per day.  If you are older than age 14, get at least 1,200 mg of calcium and at least 800 mg of vitamin D per day.  Smoking and excessive alcohol intake increase the risk of osteoporosis. Eat foods that are rich in calcium and vitamin D, and do weight-bearing exercises several times each week as directed by your health care provider. What should I know about how menopause affects my mental health? Depression may occur at any age, but it is more common as you become older. Common symptoms of depression include:  Low or sad mood.  Changes in sleep patterns.  Changes in appetite or eating patterns.  Feeling an overall lack of motivation or enjoyment of activities that you previously enjoyed.  Frequent crying spells.  Talk with your health care provider if you think that you are experiencing depression. What should I know about immunizations? It is important that you get and maintain your immunizations. These include:  Tetanus, diphtheria, and pertussis (Tdap) booster vaccine.  Influenza every year before the flu season begins.  Pneumonia vaccine.  Shingles vaccine.  Your health care provider may also recommend other immunizations. This information is not intended to replace advice given to you by your health care provider. Make sure you discuss any questions you have with your health care provider. Document Released: 11/17/2005 Document Revised: 04/14/2016 Document Reviewed: 06/29/2015 Elsevier Interactive Patient Education  2018 [employer].

## 2017-10-25 NOTE — Addendum Note (Signed)
Addended by: Thurnell Garbe A on: 10/25/2017 12:43 PM   Modules accepted: Orders

## 2017-10-25 NOTE — Progress Notes (Signed)
Lindsay Quinn 24-Apr-1956 [id number]   History:    62 y.o. G0 Married x 20 yrs  RP:  Established patient presenting for annual gyn exam   HPI: Menopause, well on no hormone replacement therapy.  Status post hysterectomy for uterine fibroids.  No pelvic pain.  Normal vaginal secretions.  Sexually active, doing well with Repl[employer].  History of left breast cancer, status post left mastectomy.  Right breast normal.  Urine and bowel movements normal.  Health labs with family physician.  Past medical history,surgical history, family history and social history were all reviewed and documented in the EPIC chart.   Gynecologic History No LMP recorded. Patient has had a hysterectomy. Contraception: status post hysterectomy Last Pap: 2017. Results were: normal Last mammogram: 09/2017. Results were: Benign, will repeat Rt screening Mammo 04/2018 Bone density more than 2 yrs ago, will schedule here  Obstetric History OB History  Gravida Para Term Preterm AB Living  0 0 0 0 0 0  SAB TAB Ectopic Multiple Live Births  0 0 0 0 0         ROS: A ROS was performed and pertinent positives and negatives are included in the history.  GENERAL: No fevers or chills. HEENT: No change in vision, no earache, sore throat or sinus congestion. NECK: No pain or stiffness. CARDIOVASCULAR: No chest pain or pressure. No palpitations. PULMONARY: No shortness of breath, cough or wheeze. GASTROINTESTINAL: No abdominal pain, nausea, vomiting or diarrhea, melena or bright red blood per rectum. GENITOURINARY: No urinary frequency, urgency, hesitancy or dysuria. MUSCULOSKELETAL: No joint or muscle pain, no back pain, no recent trauma. DERMATOLOGIC: No rash, no itching, no lesions. ENDOCRINE: No polyuria, polydipsia, no heat or cold intolerance. No recent change in weight. HEMATOLOGICAL: No anemia or easy bruising or bleeding. NEUROLOGIC: No headache, seizures, numbness, tingling or weakness. PSYCHIATRIC: No depression, no  loss of interest in normal activity or change in sleep pattern.     Exam:   BP 136/82   Ht 5\' 6"  (1.676 m)   Wt 182 lb (82.6 kg)   BMI 29.38 kg/m   Body mass index is 29.38 kg/m.  General appearance : Well developed well nourished female. No acute distress HEENT: Eyes: no retinal hemorrhage or exudates,  Neck supple, trachea midline, no carotid bruits, no thyroidmegaly Lungs: Clear to auscultation, no rhonchi or wheezes, or rib retractions  Heart: Regular rate and rhythm, no murmurs or gallops Breast:Examined in sitting and supine position.  S/P Left Mastectomy.  Right: no palpable masses or tenderness,  no skin retraction, no nipple inversion, no nipple discharge, no skin discoloration, no axillary or supraclavicular lymphadenopathy Abdomen: no palpable masses or tenderness, no rebound or guarding Extremities: no edema or skin discoloration or tenderness  Pelvic: Vulva normal  Bartholin, Urethra, Skene Glands: Within normal limits             Vagina: No gross lesions or discharge.  Pap reflex done.  Cervix/Uterus absent  Adnexa  Without masses or tenderness  Anus and perineum  normal    Assessment/Plan:  62 y.o. female for annual exam   1. Encounter for gynecological examination with abnormal finding Gynecologic exam status post hysterectomy.  Pap reflex done on vaginal vault.  Right breast exam normal.  Status post left mastectomy.  Will repeat right screening mammogram July 2019.  Colonoscopy 2017, on a 5-year schedule.  Health labs with family physician.  2. Menopause present Well on no hormone replacement therapy.  3. History of  left breast cancer Status post left mastectomy.  4. H/O total hysterectomy  5. Screening for osteoporosis Vitamin D supplements, calcium rich nutrition and regular weightbearing physical activity recommended.  Schedule bone density here. - DG Bone Density; Future  Princess Bruins MD, 12:06 PM 10/25/2017

## 2017-10-30 LAB — PAP IG W/ RFLX HPV ASCU

## 2017-10-30 LAB — HUMAN PAPILLOMAVIRUS, HIGH RISK: HPV DNA High Risk: NOT DETECTED

## 2017-11-01 ENCOUNTER — Other Ambulatory Visit: Payer: Self-pay | Admitting: Gynecology

## 2017-11-01 DIAGNOSIS — Z13820 Encounter for screening for osteoporosis: Secondary | ICD-10-CM

## 2018-01-28 ENCOUNTER — Ambulatory Visit
Admission: RE | Admit: 2018-01-28 | Discharge: 2018-01-28 | Disposition: A | Discharge status: Home / Self Care | Payer: Managed Care, Other (non HMO) | Source: Ambulatory Visit | Attending: Internal Medicine | Admitting: Internal Medicine

## 2018-01-28 ENCOUNTER — Other Ambulatory Visit: Payer: Self-pay | Admitting: Internal Medicine

## 2018-01-28 DIAGNOSIS — M79672 Pain in left foot: Secondary | ICD-10-CM

## 2018-04-19 ENCOUNTER — Other Ambulatory Visit: Payer: Managed Care, Other (non HMO)

## 2018-10-30 ENCOUNTER — Encounter: Payer: Managed Care, Other (non HMO) | Admitting: Obstetrics & Gynecology

## 2018-11-01 ENCOUNTER — Encounter: Payer: Self-pay | Admitting: Obstetrics & Gynecology

## 2018-11-01 ENCOUNTER — Ambulatory Visit (INDEPENDENT_AMBULATORY_CARE_PROVIDER_SITE_OTHER): Payer: Managed Care, Other (non HMO) | Admitting: Obstetrics & Gynecology

## 2018-11-01 VITALS — BP 120/70 | Ht 66.0 in | Wt 156.0 lb

## 2018-11-01 DIAGNOSIS — Z01419 Encounter for gynecological examination (general) (routine) without abnormal findings: Secondary | ICD-10-CM | POA: Diagnosis not present

## 2018-11-01 DIAGNOSIS — Z1272 Encounter for screening for malignant neoplasm of vagina: Secondary | ICD-10-CM

## 2018-11-01 DIAGNOSIS — Z780 Asymptomatic menopausal state: Secondary | ICD-10-CM | POA: Diagnosis not present

## 2018-11-01 DIAGNOSIS — Z13820 Encounter for screening for osteoporosis: Secondary | ICD-10-CM | POA: Diagnosis not present

## 2018-11-01 DIAGNOSIS — Z853 Personal history of malignant neoplasm of breast: Secondary | ICD-10-CM

## 2018-11-01 DIAGNOSIS — Z1151 Encounter for screening for human papillomavirus (HPV): Secondary | ICD-10-CM

## 2018-11-01 DIAGNOSIS — Z90710 Acquired absence of both cervix and uterus: Secondary | ICD-10-CM | POA: Diagnosis not present

## 2018-11-01 NOTE — Progress Notes (Signed)
Lindsay Quinn 1956-09-13 [id number]   History:    63 y.o. G0 Married  RP:  Established patient presenting for annual gyn exam   HPI: S/P Total Hysterectomy.  H/O Left Breast Ca 1995 s/p Left Mastectomy.  No pelvic pain.  No pain with IC.  Rt breast normal.  BMI 25.18.  Good fitness and healthy nutrition.  Health labs with Fam MD.  Past medical history,surgical history, family history and social history were all reviewed and documented in the EPIC chart.  Gynecologic History No LMP recorded. Patient has had a hysterectomy. Contraception: status post hysterectomy Last Pap: 10/2017. Results were: ASCUS, HPV HR negative Last mammogram: 09/2017. Results were: Probably benign.  Will schedule now. Bone Density: Never, will schedule here now. Colonoscopy: 2017  Obstetric History OB History  Gravida Para Term Preterm AB Living  0 0 0 0 0 0  SAB TAB Ectopic Multiple Live Births  0 0 0 0 0     ROS: A ROS was performed and pertinent positives and negatives are included in the history.  GENERAL: No fevers or chills. HEENT: No change in vision, no earache, sore throat or sinus congestion. NECK: No pain or stiffness. CARDIOVASCULAR: No chest pain or pressure. No palpitations. PULMONARY: No shortness of breath, cough or wheeze. GASTROINTESTINAL: No abdominal pain, nausea, vomiting or diarrhea, melena or bright red blood per rectum. GENITOURINARY: No urinary frequency, urgency, hesitancy or dysuria. MUSCULOSKELETAL: No joint or muscle pain, no back pain, no recent trauma. DERMATOLOGIC: No rash, no itching, no lesions. ENDOCRINE: No polyuria, polydipsia, no heat or cold intolerance. No recent change in weight. HEMATOLOGICAL: No anemia or easy bruising or bleeding. NEUROLOGIC: No headache, seizures, numbness, tingling or weakness. PSYCHIATRIC: No depression, no loss of interest in normal activity or change in sleep pattern.     Exam:   BP 120/70   Ht 5\' 6"  (1.676 m)   Wt 156 lb (70.8 kg)    BMI 25.18 kg/m   Body mass index is 25.18 kg/m.  General appearance : Well developed well nourished female. No acute distress HEENT: Eyes: no retinal hemorrhage or exudates,  Neck supple, trachea midline, no carotid bruits, no thyroidmegaly Lungs: Clear to auscultation, no rhonchi or wheezes, or rib retractions  Heart: Regular rate and rhythm, no murmurs or gallops Breast:Examined in sitting and supine position were symmetrical in appearance, no palpable masses or tenderness,  no skin retraction, no nipple inversion, no nipple discharge, no skin discoloration, no axillary or supraclavicular lymphadenopathy Abdomen: no palpable masses or tenderness, no rebound or guarding Extremities: no edema or skin discoloration or tenderness  Pelvic: Vulva: Normal             Vagina: No gross lesions or discharge.  Pap/HPV HR done.  Cervix/Uterus absent  Adnexa  Without masses or tenderness  Anus: Normal   Assessment/Plan:  63 y.o. female for annual exam   1. Encounter for Papanicolaou smear of vagina as part of routine gynecological examination Gynecologic exam status post total hysterectomy and menopause.  Last year's Pap test showed ASCUS with negative high risk HPV.  Pap test with high risk HPV repeated this year.  Breast exam normal status post left mastectomy for breast cancer in 1995.  Will schedule right screening mammography.  Health labs with family physician.  Colonoscopy normal in 2017.  2. S/P total hysterectomy  3. Postmenopausal Well on no hormone replacement therapy.  4. Screening for osteoporosis Will schedule bone density here now.  Recommend vitamin  D supplements, calcium intake of 1.5 g/day including nutritional and supplemental, regular weightbearing physical activities. - DG Bone Density; Future  5. History of left breast cancer Status post left mastectomy.  Schedule right screening mammogram.   Princess Bruins MD, 12:39 PM 11/01/2018

## 2018-11-01 NOTE — Patient Instructions (Signed)
1. Encounter for Papanicolaou smear of vagina as part of routine gynecological examination Gynecologic exam status post total hysterectomy and menopause.  Last year's Pap test showed ASCUS with negative high risk HPV.  Pap test with high risk HPV repeated this year.  Breast exam normal status post left mastectomy for breast cancer in 1995.  Will schedule right screening mammography.  Health labs with family physician.  Colonoscopy normal in 2017.  2. S/P total hysterectomy  3. Postmenopausal Well on no hormone replacement therapy.  4. Screening for osteoporosis Will schedule bone density here now.  Recommend vitamin D supplements, calcium intake of 1.5 g/day including nutritional and supplemental, regular weightbearing physical activities. - DG Bone Density; Future  5. History of left breast cancer Status post left mastectomy.  Schedule right screening mammogram.   Lindsay Quinn, it was a pleasure seeing you today!  I will inform you of your results as soon as they are available.

## 2018-11-01 NOTE — Addendum Note (Signed)
Addended by: Thurnell Garbe A on: 11/01/2018 01:03 PM   Modules accepted: Orders

## 2018-11-06 LAB — PAP, TP IMAGING W/ HPV RNA, RFLX HPV TYPE 16,18/45: HPV DNA High Risk: NOT DETECTED

## 2018-12-21 IMAGING — DX DG FOOT 2V*L*
2 series · 2 of 2 positions shown · non-contrast
Comparison: Left foot series of February 06, 2012 and left ankle
series of today's date

CLINICAL DATA: Pain in the arch of the foot in the great toe for
the past week. No known injury.

EXAM:
LEFT FOOT - 2 VIEW

[dg foot 2 views left (1 of 2)]
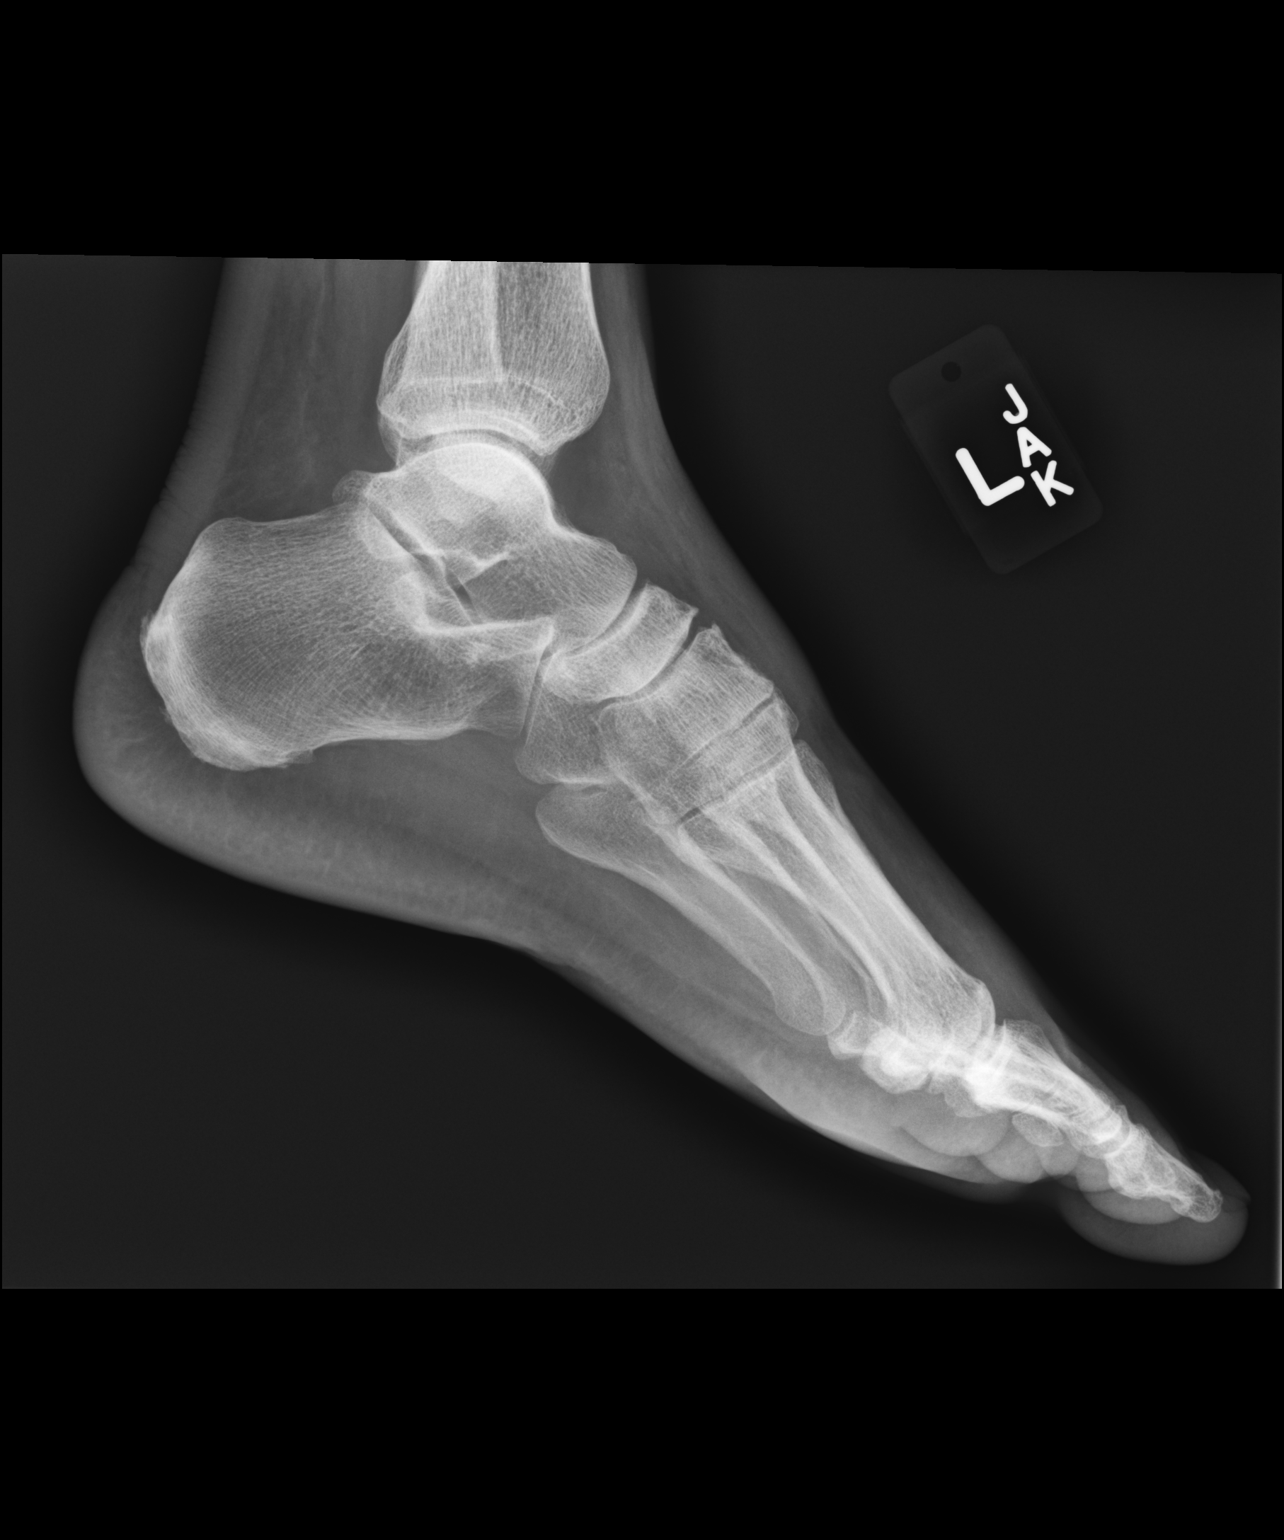

[dg foot 2 views left (2 of 2)]
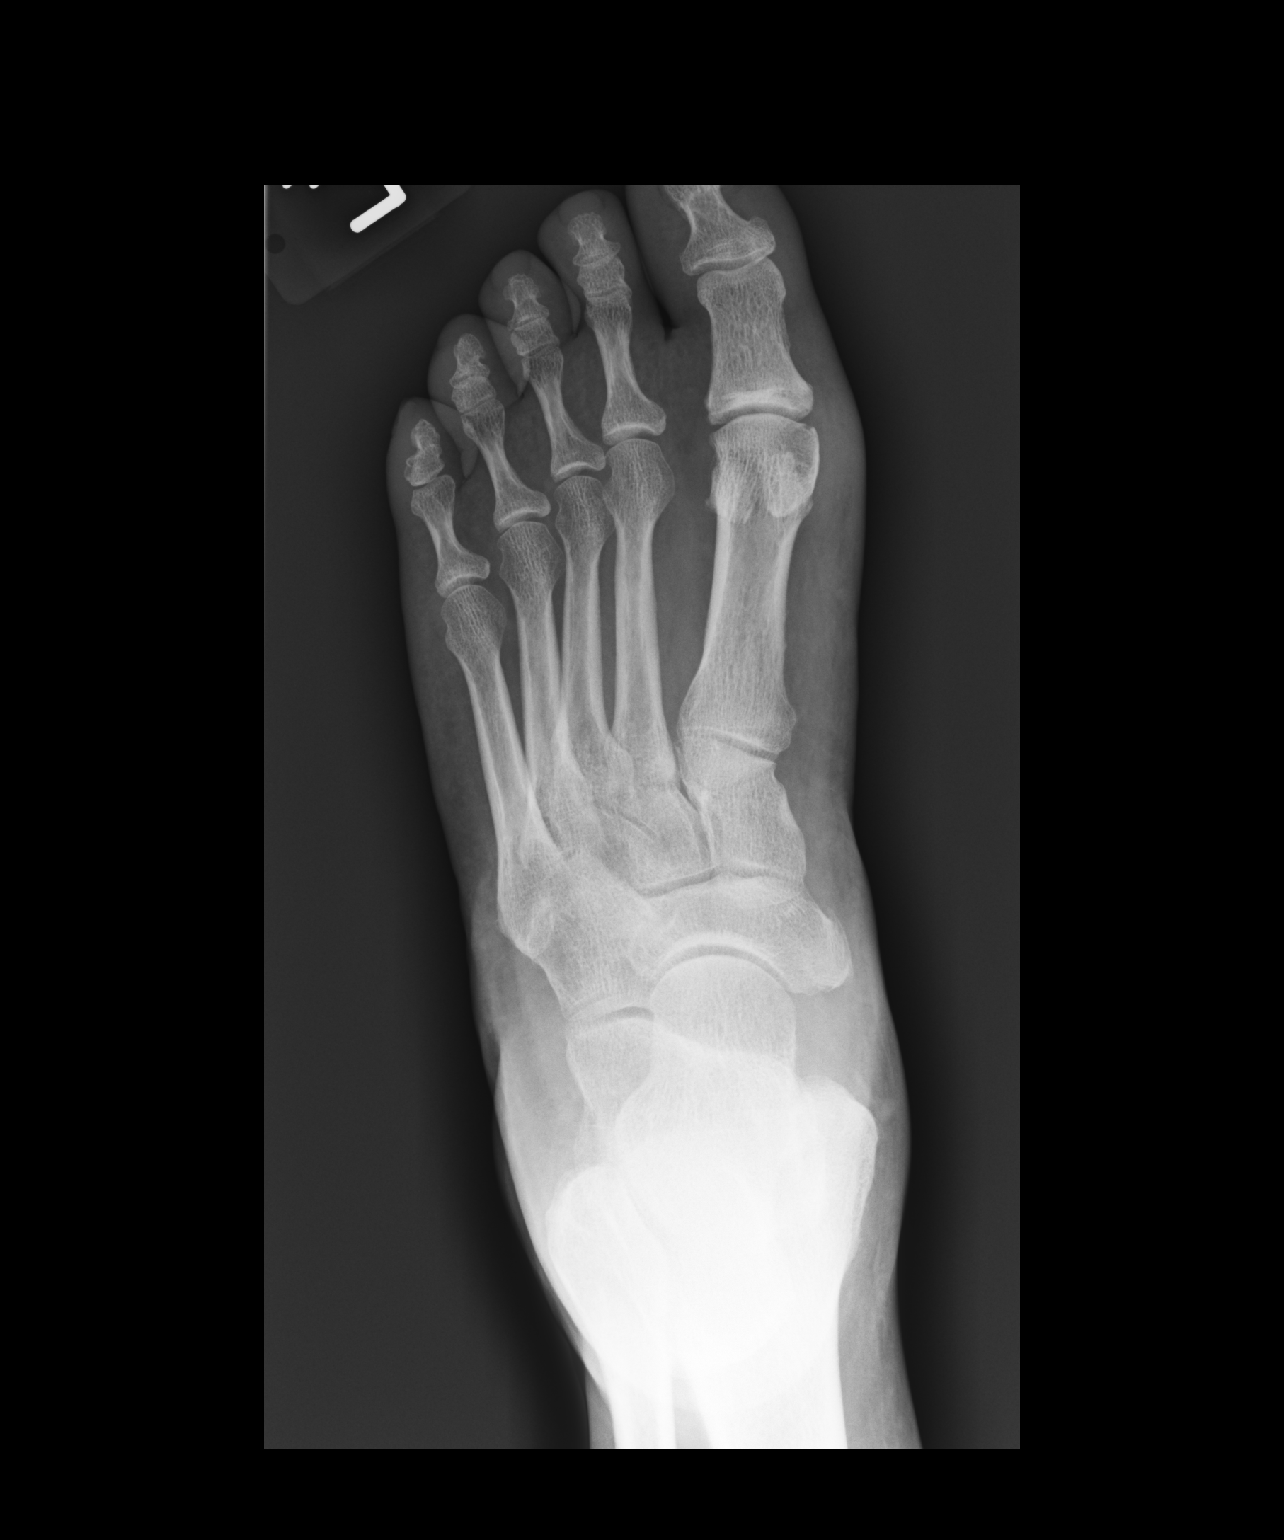

[2 of 2 positions shown; findings below may reference images not displayed]

FINDINGS: The bones are subjectively adequately mineralized. There is a tiny
erosion involving the head of the first metatarsal which is new
since the 4573 study. There is overlying soft tissue swelling. There
is minimal narrowing of the first MTP joint space. The other MTP
joint spaces are well maintained. There is mild irregularity of the
articulation of the navicular with the first cuneiform. This is
chronic. The plantar arch is preserved. There are tiny plantar and
Achilles region calcaneal spurs. There is mild soft tissue swelling
of the dorsum of the midfoot. There is mild soft tissue swelling
over the medial aspect of the first MTP joint.
IMPRESSION: Tiny erosion involving the medial aspect of the head of the first
metatarsal. This may reflect gout. There is mild overlying soft
tissue swelling.

Mild degenerative type change of the articulation of the first
cuneiform with the navicular.

Small plantar calcaneal spur.

## 2019-07-08 ENCOUNTER — Encounter: Payer: Self-pay | Admitting: Gynecology

## 2019-11-05 ENCOUNTER — Ambulatory Visit (INDEPENDENT_AMBULATORY_CARE_PROVIDER_SITE_OTHER): Payer: Managed Care, Other (non HMO) | Admitting: Obstetrics & Gynecology

## 2019-11-05 ENCOUNTER — Encounter: Payer: Self-pay | Admitting: Obstetrics & Gynecology

## 2019-11-05 ENCOUNTER — Other Ambulatory Visit: Payer: Self-pay

## 2019-11-05 VITALS — Ht 66.0 in | Wt 165.5 lb

## 2019-11-05 DIAGNOSIS — Z90710 Acquired absence of both cervix and uterus: Secondary | ICD-10-CM

## 2019-11-05 DIAGNOSIS — Z13820 Encounter for screening for osteoporosis: Secondary | ICD-10-CM

## 2019-11-05 DIAGNOSIS — Z780 Asymptomatic menopausal state: Secondary | ICD-10-CM | POA: Diagnosis not present

## 2019-11-05 DIAGNOSIS — Z01419 Encounter for gynecological examination (general) (routine) without abnormal findings: Secondary | ICD-10-CM

## 2019-11-05 DIAGNOSIS — Z853 Personal history of malignant neoplasm of breast: Secondary | ICD-10-CM

## 2019-11-05 NOTE — Patient Instructions (Signed)
1. Well female exam with routine gynecological exam Gynecologic exam status post total hysterectomy.  Pap test January 2020 was negative with negative high-risk HPV, no indication to repeat this year.  Breast exam status post left mastectomy, right breast normal.  Will schedule a right breast screening mammogram.  Colonoscopy in 2017.  Body mass index pretty good at 26.71.  Will reincrease physical activities at home.  Used to go to the gym every day.  Healthy nutrition.  Health labs with family physician.  2. S/P total hysterectomy  3. Postmenopausal Well on no hormone replacement therapy.  4. Screening for osteoporosis Schedule bone density here now.  Per patient recent vitamin D level was within normal.  Calcium intake of 1200 mg daily recommended.  Continue with regular weightbearing physical activities. - DG Bone Density; Future  5. History of left breast cancer Status post left mastectomy.  Anarely, it was a pleasure seeing you today!

## 2019-11-05 NOTE — Progress Notes (Signed)
LOANA SALVAGGIO 07/12/1956 [id number]   History:    64 y.o. G0 Married  RP:  Established patient presenting for annual gyn exam   HPI: S/P Total Hysterectomy.  H/O Left Breast Ca 1995 s/p Left Mastectomy.  No pelvic pain.  No pain with IC.  Rt breast normal.  BMI 25.18 last year, now 26.71.  Good fitness and healthy nutrition.  Health labs with Fam MD.   Past medical history,surgical history, family history and social history were all reviewed and documented in the EPIC chart.  Gynecologic History No LMP recorded. Patient has had a hysterectomy.  Obstetric History OB History  Gravida Para Term Preterm AB Living  0 0 0 0 0 0  SAB TAB Ectopic Multiple Live Births  0 0 0 0 0     ROS: A ROS was performed and pertinent positives and negatives are included in the history.  GENERAL: No fevers or chills. HEENT: No change in vision, no earache, sore throat or sinus congestion. NECK: No pain or stiffness. CARDIOVASCULAR: No chest pain or pressure. No palpitations. PULMONARY: No shortness of breath, cough or wheeze. GASTROINTESTINAL: No abdominal pain, nausea, vomiting or diarrhea, melena or bright red blood per rectum. GENITOURINARY: No urinary frequency, urgency, hesitancy or dysuria. MUSCULOSKELETAL: No joint or muscle pain, no back pain, no recent trauma. DERMATOLOGIC: No rash, no itching, no lesions. ENDOCRINE: No polyuria, polydipsia, no heat or cold intolerance. No recent change in weight. HEMATOLOGICAL: No anemia or easy bruising or bleeding. NEUROLOGIC: No headache, seizures, numbness, tingling or weakness. PSYCHIATRIC: No depression, no loss of interest in normal activity or change in sleep pattern.     Exam:   Ht 5\' 6"  (1.676 m)   Wt 165 lb 8 oz (75.1 kg)   BMI 26.71 kg/m   Body mass index is 26.71 kg/m.  General appearance : Well developed well nourished female. No acute distress HEENT: Eyes: no retinal hemorrhage or exudates,  Neck supple, trachea midline, no carotid  bruits, no thyroidmegaly Lungs: Clear to auscultation, no rhonchi or wheezes, or rib retractions  Heart: Regular rate and rhythm, no murmurs or gallops Breast:Examined in sitting and supine position were symmetrical in appearance, no palpable masses or tenderness,  no skin retraction, no nipple inversion, no nipple discharge, no skin discoloration, no axillary or supraclavicular lymphadenopathy Abdomen: no palpable masses or tenderness, no rebound or guarding Extremities: no edema or skin discoloration or tenderness  Pelvic: Vulva: Normal             Vagina: No gross lesions or discharge  Cervix/Uterus absent  Adnexa  Without masses or tenderness  Anus: Normal   Assessment/Plan:  64 y.o. female for annual exam   1. Well female exam with routine gynecological exam Gynecologic exam status post total hysterectomy.  Pap test January 2020 was negative with negative high-risk HPV, no indication to repeat this year.  Breast exam status post left mastectomy, right breast normal.  Will schedule a right breast screening mammogram.  Colonoscopy in 2017.  Body mass index pretty good at 26.71.  Will reincrease physical activities at home.  Used to go to the gym every day.  Healthy nutrition.  Health labs with family physician.  2. S/P total hysterectomy  3. Postmenopausal Well on no hormone replacement therapy.  4. Screening for osteoporosis Schedule bone density here now.  Per patient recent vitamin D level was within normal.  Calcium intake of 1200 mg daily recommended.  Continue with regular weightbearing physical activities. -  DG Bone Density; Future  5. History of left breast cancer Status post left mastectomy.  Princess Bruins MD, 9:44 AM 11/05/2019

## 2020-11-05 ENCOUNTER — Encounter: Payer: Managed Care, Other (non HMO) | Admitting: Obstetrics & Gynecology

## 2020-12-02 ENCOUNTER — Ambulatory Visit: Payer: Managed Care, Other (non HMO) | Admitting: Obstetrics & Gynecology

## 2021-01-18 ENCOUNTER — Ambulatory Visit: Payer: Managed Care, Other (non HMO) | Admitting: Obstetrics & Gynecology

## 2021-02-07 ENCOUNTER — Other Ambulatory Visit: Payer: Self-pay

## 2021-02-07 ENCOUNTER — Ambulatory Visit (INDEPENDENT_AMBULATORY_CARE_PROVIDER_SITE_OTHER): Payer: Managed Care, Other (non HMO) | Admitting: Obstetrics & Gynecology

## 2021-02-07 ENCOUNTER — Encounter: Payer: Self-pay | Admitting: Obstetrics & Gynecology

## 2021-02-07 ENCOUNTER — Other Ambulatory Visit (HOSPITAL_COMMUNITY)
Admission: RE | Admit: 2021-02-07 | Discharge: 2021-02-07 | Disposition: A | Discharge status: Home / Self Care | Payer: Managed Care, Other (non HMO) | Source: Ambulatory Visit | Attending: Obstetrics & Gynecology | Admitting: Obstetrics & Gynecology

## 2021-02-07 VITALS — BP 124/80 | Ht 65.0 in | Wt 167.0 lb

## 2021-02-07 DIAGNOSIS — Z853 Personal history of malignant neoplasm of breast: Secondary | ICD-10-CM

## 2021-02-07 DIAGNOSIS — Z1272 Encounter for screening for malignant neoplasm of vagina: Secondary | ICD-10-CM | POA: Insufficient documentation

## 2021-02-07 DIAGNOSIS — Z01419 Encounter for gynecological examination (general) (routine) without abnormal findings: Secondary | ICD-10-CM | POA: Insufficient documentation

## 2021-02-07 DIAGNOSIS — Z780 Asymptomatic menopausal state: Secondary | ICD-10-CM | POA: Diagnosis not present

## 2021-02-07 DIAGNOSIS — Z90710 Acquired absence of both cervix and uterus: Secondary | ICD-10-CM | POA: Diagnosis not present

## 2021-02-07 NOTE — Progress Notes (Signed)
Lindsay Quinn 11/24/1955 [id number]   History:    65 y.o. G0 Married  TO:[id number] presenting for annual gyn exam   HPI:S/P Total Hysterectomy. H/O Left Breast Ca 1995 s/p Left Mastectomy.  Rt breast normal.No pelvic pain. Dryness with IC and sometimes very mild spotting.  Urine/BMs normal. BMI 27.79. Good fitness and healthy nutrition. Health labs with Fam MD.  Past medical history,surgical history, family history and social history were all reviewed and documented in the EPIC chart.  Gynecologic History No LMP recorded. Patient has had a hysterectomy.  Obstetric History OB History  Gravida Para Term Preterm AB Living  0 0 0 0 0 0  SAB IAB Ectopic Multiple Live Births  0 0 0 0 0     ROS: A ROS was performed and pertinent positives and negatives are included in the history.  GENERAL: No fevers or chills. HEENT: No change in vision, no earache, sore throat or sinus congestion. NECK: No pain or stiffness. CARDIOVASCULAR: No chest pain or pressure. No palpitations. PULMONARY: No shortness of breath, cough or wheeze. GASTROINTESTINAL: No abdominal pain, nausea, vomiting or diarrhea, melena or bright red blood per rectum. GENITOURINARY: No urinary frequency, urgency, hesitancy or dysuria. MUSCULOSKELETAL: No joint or muscle pain, no back pain, no recent trauma. DERMATOLOGIC: No rash, no itching, no lesions. ENDOCRINE: No polyuria, polydipsia, no heat or cold intolerance. No recent change in weight. HEMATOLOGICAL: No anemia or easy bruising or bleeding. NEUROLOGIC: No headache, seizures, numbness, tingling or weakness. PSYCHIATRIC: No depression, no loss of interest in normal activity or change in sleep pattern.     Exam:   BP 124/80 (BP Location: Right Arm, Patient Position: Sitting, Cuff Size: Normal)   Ht 5\' 5"  (1.651 m)   Wt 167 lb (75.8 kg)   BMI 27.79 kg/m   Body mass index is 27.79 kg/m.  General appearance : Well developed well nourished female. No  acute distress HEENT: Eyes: no retinal hemorrhage or exudates,  Neck supple, trachea midline, no carotid bruits, no thyroidmegaly Lungs: Clear to auscultation, no rhonchi or wheezes, or rib retractions  Heart: Regular rate and rhythm, no murmurs or gallops Breast:Examined in sitting and supine position were symmetrical in appearance, no palpable masses or tenderness,  no skin retraction, no nipple inversion, no nipple discharge, no skin discoloration, no axillary or supraclavicular lymphadenopathy Abdomen: no palpable masses or tenderness, no rebound or guarding Extremities: no edema or skin discoloration or tenderness  Pelvic: Vulva: Normal             Vagina: No gross lesions or discharge.  Pap reflex done.  Cervix/Uterus absent  Adnexa  Without masses or tenderness  Anus: Normal   Assessment/Plan:  65 y.o. female for annual exam   1. Encounter for Papanicolaou smear of vagina as part of routine gynecological examination Gynecologic exam s/p Total Hysterectomy in menopause.  Pap reflex done on the vaginal vault.  Rt Breast exam normal/S/P Lt Mastectomy.  Schedule Rt screening mammo.  Colono 04/2016.  Health labs Fam MD.  BMI 27.79.  Continue with fitness and healthy nutrition. - Cytology - PAP( [facility])  2. S/P total hysterectomy  3. Postmenopausal Well on no HRT.  Vit D supplements, Ca++ 1.5 g/d total, regular weightbearing physical activities.  4. History of left breast cancer S/P Left Mastectomy.  Will schedule a Rt screening mammo now.  Other orders - amLODipine (NORVASC) 5 MG tablet; Take 1 tablet by mouth daily.  Princess Bruins MD, 3:05 PM 02/07/2021

## 2021-02-09 LAB — CYTOLOGY - PAP: Diagnosis: NEGATIVE

## 2021-06-28 ENCOUNTER — Other Ambulatory Visit (HOSPITAL_COMMUNITY): Payer: Self-pay | Admitting: Geriatric Medicine

## 2021-06-28 DIAGNOSIS — M79642 Pain in left hand: Secondary | ICD-10-CM

## 2021-06-28 DIAGNOSIS — M79641 Pain in right hand: Secondary | ICD-10-CM

## 2021-06-28 DIAGNOSIS — M542 Cervicalgia: Secondary | ICD-10-CM

## 2021-06-30 ENCOUNTER — Telehealth (HOSPITAL_COMMUNITY): Payer: Self-pay | Admitting: *Deleted

## 2021-06-30 NOTE — Telephone Encounter (Signed)

## 2021-07-04 ENCOUNTER — Encounter (HOSPITAL_COMMUNITY): Payer: Managed Care, Other (non HMO)

## 2021-07-06 ENCOUNTER — Telehealth (HOSPITAL_COMMUNITY): Payer: Self-pay | Admitting: Geriatric Medicine

## 2021-07-06 NOTE — Telephone Encounter (Signed)
Patient called and cancelled Myoview due to she states that she got a letter from [employer] company that they would not cover. She will follow up with Dr. Montine Circle will be removed from the [facility] and if she calls back to reschedule we will reinstate the order.

## 2021-07-29 ENCOUNTER — Ambulatory Visit: Payer: Managed Care, Other (non HMO)

## 2021-07-29 NOTE — Progress Notes (Signed)
   Covid-19 Vaccination Clinic  Name:  BETHANNIE IGLEHART    MRN: [id number] DOB: [birth year]  07/29/2021  Ms. Pincock was observed post Covid-19 immunization for 15 minutes without incident. She was provided with Vaccine Information Sheet and instruction to access the V-Safe system.   Ms. Mccance was instructed to call 911 with any severe reactions post vaccine: Difficulty breathing  Swelling of face and throat  A fast heartbeat  A bad rash all over body  Dizziness and weakness

## 2021-08-05 ENCOUNTER — Telehealth (HOSPITAL_COMMUNITY): Payer: Self-pay | Admitting: *Deleted

## 2021-08-05 NOTE — Telephone Encounter (Signed)
Left message on voicemail per DPR in reference to upcoming appointment scheduled on 08/12/21 at 10:30 with detailed instructions given per Myocardial Perfusion Study Information Sheet for the test. LM to arrive 15 minutes early, and that it is imperative to arrive on time for appointment to keep from having the test rescheduled. If you need to cancel or reschedule your appointment, please call the office within 24 hours of your appointment. Failure to do so may result in a cancellation of your appointment, and a $50 no show fee. Phone number given for call back for any questions.

## 2021-08-12 ENCOUNTER — Other Ambulatory Visit: Payer: Self-pay

## 2021-08-12 ENCOUNTER — Encounter (HOSPITAL_COMMUNITY): Payer: Managed Care, Other (non HMO)

## 2021-08-12 ENCOUNTER — Ambulatory Visit (HOSPITAL_COMMUNITY): Payer: Managed Care, Other (non HMO) | Attending: Cardiovascular Disease

## 2021-08-12 ENCOUNTER — Other Ambulatory Visit (HOSPITAL_COMMUNITY): Payer: Self-pay | Admitting: Geriatric Medicine

## 2021-08-12 DIAGNOSIS — M79641 Pain in right hand: Secondary | ICD-10-CM | POA: Insufficient documentation

## 2021-08-12 DIAGNOSIS — M542 Cervicalgia: Secondary | ICD-10-CM | POA: Diagnosis not present

## 2021-08-12 DIAGNOSIS — M79642 Pain in left hand: Secondary | ICD-10-CM

## 2021-08-12 LAB — MYOCARDIAL PERFUSION IMAGING
Angina Index: 0
Duke Treadmill Score: 6
Estimated workload: 7
Exercise duration (min): 6 min
Exercise duration (sec): 0 s
LV dias vol: 92 mL (ref 46–106)
LV sys vol: 54 mL
MPHR: 158 {beats}/min
Nuc Stress EF: 41 %
Peak HR: 164 {beats}/min
Percent HR: 105 %
Rest HR: 91 {beats}/min
Rest Nuclear Isotope Dose: 10.2 mCi
SRS: 2
SSS: 2
ST Depression (mm): 0 mm
Stress Nuclear Isotope Dose: 30.9 mCi
TID: 1.09

## 2021-08-12 MED ORDER — TECHNETIUM TC 99M TETROFOSMIN IV KIT
10.2000 | PACK | Freq: Once | INTRAVENOUS | Status: AC | PRN
  Administered 2021-08-12: 10.2 via INTRAVENOUS
  Filled 2021-08-12: qty 11

## 2021-08-12 MED ORDER — TECHNETIUM TC 99M TETROFOSMIN IV KIT
30.9000 | PACK | Freq: Once | INTRAVENOUS | Status: AC | PRN
  Administered 2021-08-12: 30.9 via INTRAVENOUS
  Filled 2021-08-12: qty 31

## 2022-02-10 ENCOUNTER — Ambulatory Visit: Payer: Managed Care, Other (non HMO) | Admitting: Obstetrics & Gynecology

## 2022-04-20 ENCOUNTER — Ambulatory Visit: Payer: Managed Care, Other (non HMO) | Admitting: Obstetrics & Gynecology

## 2022-05-11 ENCOUNTER — Ambulatory Visit (INDEPENDENT_AMBULATORY_CARE_PROVIDER_SITE_OTHER): Payer: Managed Care, Other (non HMO) | Admitting: Obstetrics & Gynecology

## 2022-05-11 ENCOUNTER — Encounter: Payer: Self-pay | Admitting: Obstetrics & Gynecology

## 2022-05-11 VITALS — BP 128/80 | HR 105 | Ht 65.25 in | Wt 161.0 lb

## 2022-05-11 DIAGNOSIS — Z853 Personal history of malignant neoplasm of breast: Secondary | ICD-10-CM

## 2022-05-11 DIAGNOSIS — Z13820 Encounter for screening for osteoporosis: Secondary | ICD-10-CM

## 2022-05-11 DIAGNOSIS — Z780 Asymptomatic menopausal state: Secondary | ICD-10-CM

## 2022-05-11 DIAGNOSIS — Z90710 Acquired absence of both cervix and uterus: Secondary | ICD-10-CM | POA: Diagnosis not present

## 2022-05-11 DIAGNOSIS — Z01419 Encounter for gynecological examination (general) (routine) without abnormal findings: Secondary | ICD-10-CM | POA: Diagnosis not present

## 2022-05-11 NOTE — Progress Notes (Signed)
Lindsay Quinn [birth year] [id number]   History:    66 y.o.  G0 Married.  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   RP:  Established patient presenting for annual gyn exam    HPI: S/P Total Hysterectomy.  H/O Left Breast Ca 1995 s/p Left Mastectomy.  Rt breast normal.  Right mammo/US 09/2017.  Will schedule Rt screening mammo now at the [place].  No pelvic pain.  Dryness and pain with IC.  Recommend Coconut oil.  Pap ASCUS/HPV HR Neg in 2019.  Pap Neg 02/2021.  Will repeat Pap at 6 yrs.  Urine/BMs normal.  Colono 2022.  BMI 26.59.  Good fitness and healthy nutrition. Bone density normal many years ago at [facility].  Will repeat BD here now.  Health labs with Fam MD.   Past medical history,surgical history, family history and social history were all reviewed and documented in the EPIC chart.  Gynecologic History No LMP recorded. Patient has had a hysterectomy.  Obstetric History OB History  Gravida Para Term Preterm AB Living  0 0 0 0 0 0  SAB IAB Ectopic Multiple Live Births  0 0 0 0 0     ROS: A ROS was performed and pertinent positives and negatives are included in the history.  GENERAL: No fevers or chills. HEENT: No change in vision, no earache, sore throat or sinus congestion. NECK: No pain or stiffness. CARDIOVASCULAR: No chest pain or pressure. No palpitations. PULMONARY: No shortness of breath, cough or wheeze. GASTROINTESTINAL: No abdominal pain, nausea, vomiting or diarrhea, melena or bright red blood per rectum. GENITOURINARY: No urinary frequency, urgency, hesitancy or dysuria. MUSCULOSKELETAL: No joint or muscle pain, no back pain, no recent trauma. DERMATOLOGIC: No rash, no itching, no lesions. ENDOCRINE: No polyuria, polydipsia, no heat or cold intolerance. No recent change in weight. HEMATOLOGICAL: No anemia or easy bruising or bleeding. NEUROLOGIC: No headache, seizures, numbness, tingling or weakness. PSYCHIATRIC: No depression, no loss of interest in normal  activity or change in sleep pattern.     Exam:   BP 128/80   Pulse (!) 105   Ht 5' 5.25" (1.657 m)   Wt 161 lb (73 kg)   SpO2 99%   BMI 26.59 kg/m   Body mass index is 26.59 kg/m.  General appearance : Well developed well nourished female. No acute distress HEENT: Eyes: no retinal hemorrhage or exudates,  Neck supple, trachea midline, no carotid bruits, no thyroidmegaly Lungs: Clear to auscultation, no rhonchi or wheezes, or rib retractions  Heart: Regular rate and rhythm, no murmurs or gallops Breast:Examined in sitting and supine position were symmetrical in appearance, no palpable masses or tenderness,  no skin retraction, no nipple inversion, no nipple discharge, no skin discoloration, no axillary or supraclavicular lymphadenopathy Abdomen: no palpable masses or tenderness, no rebound or guarding Extremities: no edema or skin discoloration or tenderness  Pelvic: Vulva: Normal             Vagina: No gross lesions or discharge  Cervix/Uterus absent  Adnexa  Without masses or tenderness  Anus: Normal   Assessment/Plan:  66 y.o. female for annual exam   1. Well female exam with routine gynecological exam S/P Total Hysterectomy.  H/O Left Breast Ca 1995 s/p Left Mastectomy.  Rt breast normal.  Right mammo/US 09/2017.  Will schedule Rt screening mammo now at the [facility].  No pelvic pain.  Dryness and pain with IC.  Recommend Coconut oil.  Pap ASCUS/HPV HR Neg  in 2019.  Pap Neg 02/2021.  Will repeat Pap at 6 yrs.  Urine/BMs normal.  Colono 2022.  BMI 26.59.  Good fitness and healthy nutrition. Bone density normal many years ago at [facility].  Will repeat BD here now.  Health labs with Fam MD.  2. S/P total hysterectomy  3. Postmenopausal Postmenopause, well on no HRT.  S/P Total Hysterectomy.  - DG Bone Density; Future  4. Screening for osteoporosis Postmenopause.  Recommend vit D supplements, Ca++ total 1.5 g/d, regular weight bearing activities.  Schedule BD here  now. - DG Bone Density; Future  5. History of left breast cancer H/O Left Breast Ca 1995 s/p Left Mastectomy.  Rt breast normal.  Right mammo/US 09/2017.  Will schedule Rt screening mammo now at the [facility].  Other orders - allopurinol (ZYLOPRIM) 100 MG tablet; Take 100 mg by mouth daily. - atorvastatin (LIPITOR) 40 MG tablet; Take 40 mg by mouth daily. - JARDIANCE 10 MG TABS tablet; Take 10 mg by mouth daily. - lisinopril (ZESTRIL) 10 MG tablet; Take 10 mg by mouth daily. - Ascorbic Acid (VITAMIN C PO); Take by mouth. - B Complex Vitamins (B COMPLEX PO); Take by mouth.   Princess Bruins MD, 10:36 AM 05/11/2022

## 2022-07-04 ENCOUNTER — Other Ambulatory Visit: Payer: Self-pay | Admitting: Geriatric Medicine

## 2022-07-04 ENCOUNTER — Ambulatory Visit
Admission: RE | Admit: 2022-07-04 | Discharge: 2022-07-04 | Disposition: A | Discharge status: Home / Self Care | Payer: Managed Care, Other (non HMO) | Source: Ambulatory Visit | Attending: Geriatric Medicine | Admitting: Geriatric Medicine

## 2022-07-04 DIAGNOSIS — R0989 Other specified symptoms and signs involving the circulatory and respiratory systems: Secondary | ICD-10-CM

## 2022-07-04 DIAGNOSIS — M542 Cervicalgia: Secondary | ICD-10-CM

## 2022-08-03 ENCOUNTER — Other Ambulatory Visit: Payer: Self-pay | Admitting: Internal Medicine

## 2022-08-03 ENCOUNTER — Ambulatory Visit
Admission: RE | Admit: 2022-08-03 | Discharge: 2022-08-03 | Disposition: A | Discharge status: Home / Self Care | Payer: Managed Care, Other (non HMO) | Source: Ambulatory Visit | Attending: Internal Medicine | Admitting: Internal Medicine

## 2022-08-03 DIAGNOSIS — R059 Cough, unspecified: Secondary | ICD-10-CM

## 2022-09-07 ENCOUNTER — Other Ambulatory Visit: Payer: Self-pay | Admitting: Internal Medicine

## 2022-09-07 ENCOUNTER — Ambulatory Visit
Admission: RE | Admit: 2022-09-07 | Discharge: 2022-09-07 | Disposition: A | Discharge status: Home / Self Care | Payer: Managed Care, Other (non HMO) | Source: Ambulatory Visit | Attending: Internal Medicine | Admitting: Internal Medicine

## 2022-09-07 DIAGNOSIS — J90 Pleural effusion, not elsewhere classified: Secondary | ICD-10-CM

## 2022-09-12 ENCOUNTER — Other Ambulatory Visit: Payer: Self-pay | Admitting: Internal Medicine

## 2022-09-12 DIAGNOSIS — J90 Pleural effusion, not elsewhere classified: Secondary | ICD-10-CM

## 2022-09-13 ENCOUNTER — Ambulatory Visit
Admission: RE | Admit: 2022-09-13 | Discharge: 2022-09-13 | Disposition: A | Discharge status: Home / Self Care | Payer: Managed Care, Other (non HMO) | Source: Ambulatory Visit | Attending: Internal Medicine | Admitting: Internal Medicine

## 2022-09-13 DIAGNOSIS — J90 Pleural effusion, not elsewhere classified: Secondary | ICD-10-CM

## 2022-09-19 ENCOUNTER — Ambulatory Visit: Payer: Managed Care, Other (non HMO) | Admitting: Internal Medicine

## 2022-09-20 NOTE — Progress Notes (Unsigned)
Cardiology Office Note:    Date:  09/21/2022   ID:  Lindsay Quinn, DOB Oct 26, 1955, MRN [id number]  PCP:  Charlane Ferretti, MD  Cardiologist:  Sinclair Grooms, MD   Referring MD: Charlane Ferretti, MD   Chief Complaint  Patient presents with   Hypertension   Congestive Heart Failure   Hyperlipidemia   Advice Only    CKD stage IV Possible CAD with EKG demonstrating poor R wave progression    History of Present Illness:    Lindsay Quinn is a 66 y.o. female with a hx of chronic diastolic heart failure referred for cardiac evaluation.  Significant medical problems include stage IV CKD, primary hypertension, hyperlipidemia, type 2 diabetes mellitus,, and history of breast cancer (left).  96-monthhistory of cough with accompanying symptoms of orthopnea, not improved with antibiotics and cough suppressants.  Elevated BNP and abnormal echocardiogram led to concern for heart failure.  In reviewing records from [facility] stress nuclear study demonstrated EF 35 to 40% several years ago.  The recent echo, which is not available for review, suggests global hypokinesis but an estimated EF of 51% and mention of diastolic dysfunction.  Lasix was started and improved her clinical status but she still has significant lower extremity swelling and cough when recumbent and when sitting.  Longstanding history of elevated blood pressure.  Prior history of breast cancer with chemotherapy.  Known diabetic but no prior history of ischemic events.  Chronic kidney disease stage IV of unknown duration.  There is also a history of hypertension with questionable control.  She denies chest pain.  No history of stroke.  Past Medical History:  Diagnosis Date   [place]   [place] breast   [place]   BREAST, LEFT   Chronic kidney disease    FOLLOWED BY MD   Complication of anesthesia    SLOW TO AWAKEN    Diabetes mellitus without complication ([facility]    Gout    Hand paresthesia     History of adenomatous polyp of colon    Hyperlipidemia    Hypertension    Low blood potassium SINCE 1995 OR 1996   Type II diabetes mellitus with nephropathy ([facility]     Past Surgical History:  Procedure Laterality Date   ABDOMINAL HYSTERECTOMY     PARTIAL   BREAST EXCISIONAL BIOPSY Right over 10+ yrs ago   benign   BREAST SURGERY Left 1995   LYMPH NODE REMOVED, AND CHEMO DONE   COLONOSCOPY WITH PROPOFOL N/A 05/08/2016   Procedure: COLONOSCOPY WITH PROPOFOL;  Surgeon: MGarlan Fair MD;  Location: [facility];  Service: Endoscopy;  Laterality: N/A;   MASTECTOMY Left 2002    Current Medications: Current Meds  Medication Sig   allopurinol (ZYLOPRIM) 100 MG tablet Take 100 mg by mouth daily.   atorvastatin (LIPITOR) 40 MG tablet Take 40 mg by mouth daily.   B Complex Vitamins (B COMPLEX PO) Take by mouth.   benzonatate (TESSALON) 200 MG capsule Take 200 mg by mouth 3 (three) times daily as needed for cough.   CALCIUM PO Take 1 tablet by mouth daily.   JARDIANCE 10 MG TABS tablet Take 10 mg by mouth daily.   KLOR-CON M20 20 MEQ tablet Take 20 mEq by mouth daily.   lisinopril (ZESTRIL) 10 MG tablet Take 10 mg by mouth daily.   losartan (COZAAR) 50 MG tablet Take 50 mg by mouth daily.   metFORMIN (GLUCOPHAGE-XR) 500 MG  24 hr tablet Take 500 mg by mouth daily.   Multiple Vitamin (MULTIVITAMIN) tablet Take 1 tablet by mouth daily.   [DISCONTINUED] furosemide (LASIX) 40 MG tablet Take 40 mg by mouth.   [DISCONTINUED] furosemide (LASIX) 40 MG tablet Take 1 tablet (40 mg total) by mouth 2 (two) times daily.     Allergies:   Clindamycin/lincomycin   Social History   Socioeconomic History   Marital status: Married    Spouse name: Not on file   Number of children: Not on file   Years of education: Not on file   Highest education level: Not on file  Occupational History   Not on file  Tobacco Use   Smoking status: Never   Smokeless tobacco: Never  Vaping Use   Vaping Use:  Never used  Substance and Sexual Activity   Alcohol use: No   Drug use: No   Sexual activity: Yes    Partners: Male    Birth control/protection: Surgical    Comment: 1st intercourse- 18, partners- more than 5  Other Topics Concern   Not on file  Social History Narrative   Not on file   Social Determinants of Health   Financial Resource Strain: Not on file  Food Insecurity: Not on file  Transportation Needs: Not on file  Physical Activity: Not on file  Stress: Not on file  Social Connections: Not on file     Family History: The patient's family history includes Cancer in her mother and another family member; Diabetes in her maternal grandmother and mother; Hyperlipidemia in her maternal grandmother; Hypertension in an other family member.  ROS:   Please see the history of present illness.    Appetite is good.  Still complaining of cough.  All other systems reviewed and are negative.  EKGs/Labs/Other Studies Reviewed:    The following studies were reviewed today:  Echocardiogram at [facility]:  Performed in October demonstrating mild global hypokinesis but reported EF 12%, diastolic dysfunction, moderate mitral and tricuspid regurgitation.  Full report not available.  EKG:  EKG sinus tachycardia, atrial abnormality, poor R wave progression V1 through V4.  Cannot exclude prior anterior infarction.  No prior tracing to compare.  Recent Labs: No results found for requested labs within last 365 days.  Recent Lipid Panel No results found for: "CHOL", "TRIG", "HDL", "CHOLHDL", "VLDL", "LDLCALC", "LDLDIRECT"  Physical Exam:    VS:  BP (!) 158/98   Pulse (!) 117   Ht 5' 5.25" (1.657 m)   Wt 156 lb 3.2 oz (70.9 kg)   SpO2 98%   BMI 25.79 kg/m     Wt Readings from Last 3 Encounters:  09/21/22 156 lb 3.2 oz (70.9 kg)  05/11/22 161 lb (73 kg)  08/12/21 167 lb (75.8 kg)     GEN: Appears younger than stated age. No acute distress HEENT: Normal NECK: Marked JVD to the angle of  the jaw bilaterally while sitting. LYMPHATICS: No lymphadenopathy CARDIAC: No obvious murmur. RRR S3 but no S4 gallop, with bilateral 3+ peripheral edema. VASCULAR:  Normal Pulses. No bruits. RESPIRATORY:  Clear to auscultation without rales, wheezing or rhonchi  ABDOMEN: Soft, non-tender, non-distended, No pulsatile mass, MUSCULOSKELETAL: No deformity  SKIN: Warm and dry NEUROLOGIC:  Alert and oriented x 3 PSYCHIATRIC:  Normal affect   ASSESSMENT:    1. Acute on chronic combined systolic and diastolic heart for   2. Hyperlipidemia LDL goal <70   3. Primary hypertension   4. Benign hypertension with chronic kidney  disease, stage IV ([place])   5. Type 2 diabetes mellitus with complication, without long-term current use of insulin ([place])   6. Nonspecific abnormal electrocardiogram (ECG) (EKG)    PLAN:    In order of problems listed above:  Increase furosemide to 40 mg twice daily for the next 3 to 4 days.  Weigh daily.  Basic metabolic panel and BNP today.  Depending upon findings, adjustment in therapy may need to be made.  I am interested in her creatinine and potassium level.  She may actually fare better with admission to the hospital.  Will contemplate that as well.  Until she is volume neutral, beta-blocker therapy cannot be added.  Hopefully more aggressive diuresis will bring her blood pressure down.  Continue Cozaar, Jardiance, and Klor-Con.  We have ordered an echo to have 1 within our system. Continue statin therapy\ Blood pressure needs to come down.  If creatinine is still elevated we will institute hydralazine and long-acting nitrates. Basic metabolic panel and BNP are ordered today. Continue current therapy. EKG demonstrates poor R wave progression which may suggest silent anterior infarct.   1 week follow-up with repeat Bmet, review of charting weights, and advised to call us if she is feeling worse instead of better with more aggressive diuresis.   Medication  Adjustments/Labs and Tests Ordered: Current medicines are reviewed at length with the patient today.  Concerns regarding medicines are outlined above.  Orders Placed This Encounter  Procedures   Basic metabolic panel   Pro b natriuretic peptide (BNP)   Basic metabolic panel   EKG [id number]   ECHOCARDIOGRAM COMPLETE   Meds ordered this encounter  Medications   DISCONTD: furosemide (LASIX) 40 MG tablet    Sig: Take 1 tablet (40 mg total) by mouth 2 (two) times daily.    Dispense:  90 tablet    Refill:  3    Dose change   furosemide (LASIX) 40 MG tablet    Sig: Take 1 tablet (40 mg total) by mouth 2 (two) times daily.    Dispense:  30 tablet    Refill:  1    Dose change    Patient Instructions  Medication Instructions:  Your physician has recommended you make the following change in your medication:   1) START furosemide (Lasix) '40mg'$  twice daily  *If you need a refill on your cardiac medications before your next appointment, please call your pharmacy*  Lab Work: TODAY: BMET, BNP On 09/25/2022: BMET If you have labs (blood work) drawn today and your tests are completely normal, you will receive your results only by: [place] (if you have MyChart) OR A paper copy in the mail If you have any lab test that is abnormal or we need to change your treatment, we will call you to review the results.  Testing/Procedures: Your physician has requested that you have an echocardiogram as soon as possible. Echocardiography is a painless test that uses sound waves to create images of your heart. It provides your doctor with information about the size and shape of your heart and how well your heart's chambers and valves are working. This procedure takes approximately one hour. There are no restrictions for this procedure. Please do NOT wear cologne, perfume, aftershave, or lotions (deodorant is allowed). Please arrive 15 minutes prior to your appointment time.  Follow-Up: At [facility], you and your health needs are our priority.  As part of our continuing mission to provide you with exceptional heart care, we  have created designated Provider Care Teams.  These Care Teams include your primary Cardiologist (physician) and Advanced Practice Providers (APPs -  Physician Assistants and Nurse Practitioners) who all work together to provide you with the care you need, when you need it.  Your next appointment:   5 day(s)  The format for your next appointment:   In Person  Provider:   Sinclair Grooms, MD   Other Instructions Your physician has requested that you weigh yourself every morning, after using the bathroom and before eating/drinking anything. Record you weights and bring them to your appointment on 09/26/2022 at 03:20PM.  Important Information About Sugar         Signed, Sinclair Grooms, MD  09/21/2022 5:28 PM    [place]

## 2022-09-21 ENCOUNTER — Ambulatory Visit: Payer: Managed Care, Other (non HMO) | Attending: Internal Medicine | Admitting: Interventional Cardiology

## 2022-09-21 ENCOUNTER — Encounter: Payer: Self-pay | Admitting: Interventional Cardiology

## 2022-09-21 VITALS — BP 158/98 | HR 117 | Ht 65.25 in | Wt 156.2 lb

## 2022-09-21 DIAGNOSIS — I129 Hypertensive chronic kidney disease with stage 1 through stage 4 chronic kidney disease, or unspecified chronic kidney disease: Secondary | ICD-10-CM | POA: Diagnosis not present

## 2022-09-21 DIAGNOSIS — I10 Essential (primary) hypertension: Secondary | ICD-10-CM | POA: Diagnosis not present

## 2022-09-21 DIAGNOSIS — E785 Hyperlipidemia, unspecified: Secondary | ICD-10-CM | POA: Diagnosis not present

## 2022-09-21 DIAGNOSIS — E118 Type 2 diabetes mellitus with unspecified complications: Secondary | ICD-10-CM

## 2022-09-21 DIAGNOSIS — I5042 Chronic combined systolic (congestive) and diastolic (congestive) heart failure: Secondary | ICD-10-CM | POA: Diagnosis not present

## 2022-09-21 DIAGNOSIS — N184 Chronic kidney disease, stage 4 (severe): Secondary | ICD-10-CM

## 2022-09-21 DIAGNOSIS — R9431 Abnormal electrocardiogram [ECG] [EKG]: Secondary | ICD-10-CM

## 2022-09-21 MED ORDER — FUROSEMIDE 40 MG PO TABS: 40.0000 mg | ORAL_TABLET | Freq: Two times a day (BID) | ORAL | 1 refills | Status: DC

## 2022-09-21 MED ORDER — FUROSEMIDE 40 MG PO TABS: 40.0000 mg | ORAL_TABLET | Freq: Two times a day (BID) | ORAL | 3 refills | Status: DC

## 2022-09-21 NOTE — Patient Instructions (Addendum)
Medication Instructions:  Your physician has recommended you make the following change in your medication:   1) START furosemide (Lasix) '40mg'$  twice daily  *If you need a refill on your cardiac medications before your next appointment, please call your pharmacy*  Lab Work: TODAY: BMET, BNP On 09/25/2022: BMET If you have labs (blood work) drawn today and your tests are completely normal, you will receive your results only by: [place] (if you have MyChart) OR A paper copy in the mail If you have any lab test that is abnormal or we need to change your treatment, we will call you to review the results.  Testing/Procedures: Your physician has requested that you have an echocardiogram as soon as possible. Echocardiography is a painless test that uses sound waves to create images of your heart. It provides your doctor with information about the size and shape of your heart and how well your heart's chambers and valves are working. This procedure takes approximately one hour. There are no restrictions for this procedure. Please do NOT wear cologne, perfume, aftershave, or lotions (deodorant is allowed). Please arrive 15 minutes prior to your appointment time.  Follow-Up: At [facility], you and your health needs are our priority.  As part of our continuing mission to provide you with exceptional heart care, we have created designated Provider Care Teams.  These Care Teams include your primary Cardiologist (physician) and Advanced Practice Providers (APPs -  Physician Assistants and Nurse Practitioners) who all work together to provide you with the care you need, when you need it.  Your next appointment:   5 day(s)  The format for your next appointment:   In Person  Provider:   Sinclair Grooms, MD   Other Instructions Your physician has requested that you weigh yourself every morning, after using the bathroom and before eating/drinking anything. Record you weights and  bring them to your appointment on 09/26/2022 at 03:20PM.  Important Information About Sugar

## 2022-09-22 ENCOUNTER — Telehealth: Payer: Self-pay | Admitting: *Deleted

## 2022-09-22 ENCOUNTER — Ambulatory Visit (HOSPITAL_COMMUNITY)
Admission: RE | Admit: 2022-09-22 | Discharge: 2022-09-22 | Disposition: A | Discharge status: Home / Self Care | Payer: Managed Care, Other (non HMO) | Source: Ambulatory Visit | Attending: Interventional Cardiology | Admitting: Interventional Cardiology

## 2022-09-22 DIAGNOSIS — I5042 Chronic combined systolic (congestive) and diastolic (congestive) heart failure: Secondary | ICD-10-CM | POA: Diagnosis present

## 2022-09-22 DIAGNOSIS — I340 Nonrheumatic mitral (valve) insufficiency: Secondary | ICD-10-CM | POA: Diagnosis not present

## 2022-09-22 DIAGNOSIS — Z853 Personal history of malignant neoplasm of breast: Secondary | ICD-10-CM | POA: Diagnosis not present

## 2022-09-22 DIAGNOSIS — N189 Chronic kidney disease, unspecified: Secondary | ICD-10-CM | POA: Insufficient documentation

## 2022-09-22 LAB — ECHOCARDIOGRAM COMPLETE
AR max vel: 2.65 cm2
AV Area VTI: 2.78 cm2
AV Area mean vel: 2.77 cm2
AV Mean grad: 3 mmHg
AV Peak grad: 4.8 mmHg
Ao pk vel: 1.1 m/s
Calc EF: 34 %
MV M vel: 5.04 m/s
MV Peak grad: 101.6 mmHg
Radius: 0.5 cm
S' Lateral: 4.7 cm
Single Plane A2C EF: 37.3 %
Single Plane A4C EF: 32.1 %

## 2022-09-22 LAB — BASIC METABOLIC PANEL
BUN/Creatinine Ratio: 16 (ref 12–28)
BUN: 27 mg/dL (ref 8–27)
CO2: 24 mmol/L (ref 20–29)
Calcium: 8.6 mg/dL — ABNORMAL LOW (ref 8.7–10.3)
Chloride: 108 mmol/L — ABNORMAL HIGH (ref 96–106)
Creatinine, Ser: 1.7 mg/dL — ABNORMAL HIGH (ref 0.57–1.00)
Glucose: 86 mg/dL (ref 70–99)
Potassium: 4.4 mmol/L (ref 3.5–5.2)
Sodium: 146 mmol/L — ABNORMAL HIGH (ref 134–144)
eGFR: 33 mL/min/{1.73_m2} — ABNORMAL LOW (ref >59)

## 2022-09-22 LAB — PRO B NATRIURETIC PEPTIDE: NT-Pro BNP: 11752 pg/mL — ABNORMAL HIGH (ref 0–301)

## 2022-09-22 MED ORDER — FUROSEMIDE 40 MG PO TABS: 40.0000 mg | ORAL_TABLET | Freq: Two times a day (BID) | ORAL | 0 refills | Status: DC

## 2022-09-22 NOTE — Progress Notes (Signed)
  Echocardiogram 2D Echocardiogram has been performed.  Lindsay Quinn 09/22/2022, 9:35 AM

## 2022-09-22 NOTE — Telephone Encounter (Signed)
Labs reviewed with Dr Tamala Julian.  Per Dr Tamala Julian-- Please call her to see if she feels okay and let her know that if she worsens, over weekend at any point , go to the [facility] ER  I spoke with patient who reports she feels great.  Swelling has gone down.  Patient made aware she should go to [facility] ED if condition worsens.  Patient's dose of furosemide was increased at last office visit. Patient increased dose but needs new prescription sent to [facility] in [place] as she will run out prior to appointment next week.  One month refill sent to pharmacy.

## 2022-09-24 NOTE — Progress Notes (Signed)
Cardiology Office Note:    Date:  09/26/2022   ID:  Lindsay Quinn, DOB 1956/03/28, MRN [id number]  PCP:  Charlane Ferretti, MD  Cardiologist:  Sinclair Grooms, MD   Referring MD: Charlane Ferretti, MD   Chief Complaint  Patient presents with   Congestive Heart Failure    History of Present Illness:    Lindsay Quinn is a 66 y.o. female with a hx of stage III b CKD, primary hypertension, hyperlipidemia, type 2 diabetes mellitus, history of breast cancer (left), and acute on chronic combined systolic and diastolic HF.  She has tolerated the current medication regimen changes.  She is unsure if she has been on 40 mg twice a day or 20 mg twice a day of furosemide.  Lower extremity swelling is improving.  Cough is improving.  She still has dyspnea on exertion.  No chest pain.  We reviewed the results of her echocardiogram which revealed evidence of biventricular failure and much worse left ventricular function than noted on the echo from [place].  Past Medical History:  Diagnosis Date   Breast cancer ([place]) 2002   left breast   Cancer ([place]) 1995   BREAST, LEFT   Chronic kidney disease    FOLLOWED BY MD   Complication of anesthesia    SLOW TO AWAKEN    Diabetes mellitus without complication ([place])    Gout    Hand paresthesia    History of adenomatous polyp of colon    Hyperlipidemia    Hypertension    Low blood potassium SINCE 1995 OR 1996   Type II diabetes mellitus with nephropathy ([place])     Past Surgical History:  Procedure Laterality Date   ABDOMINAL HYSTERECTOMY     PARTIAL   BREAST EXCISIONAL BIOPSY Right over 10+ yrs ago   benign   BREAST SURGERY Left 1995   LYMPH NODE REMOVED, AND CHEMO DONE   COLONOSCOPY WITH PROPOFOL N/A 05/08/2016   Procedure: COLONOSCOPY WITH PROPOFOL;  Surgeon: Garlan Fair, MD;  Location: [facility];  Service: Endoscopy;  Laterality: N/A;   MASTECTOMY Left 2002    Current Medications: Current Meds  Medication Sig   allopurinol  (ZYLOPRIM) 100 MG tablet Take 100 mg by mouth daily.   atorvastatin (LIPITOR) 40 MG tablet Take 40 mg by mouth daily.   B Complex Vitamins (B COMPLEX PO) Take by mouth.   CALCIUM PO Take 1 tablet by mouth daily.   furosemide (LASIX) 40 MG tablet Take 1 tablet (40 mg total) by mouth 2 (two) times daily.   JARDIANCE 10 MG TABS tablet Take 10 mg by mouth daily.   KLOR-CON M20 20 MEQ tablet Take 20 mEq by mouth daily.   metFORMIN (GLUCOPHAGE-XR) 500 MG 24 hr tablet Take 500 mg by mouth daily.   Multiple Vitamin (MULTIVITAMIN) tablet Take 1 tablet by mouth daily.   sacubitril-valsartan (ENTRESTO) 24-26 MG Take 1 tablet by mouth 2 (two) times daily.   [DISCONTINUED] losartan (COZAAR) 50 MG tablet Take 50 mg by mouth daily.     Allergies:   Clindamycin/lincomycin   Social History   Socioeconomic History   Marital status: Married    Spouse name: Not on file   Number of children: Not on file   Years of education: Not on file   Highest education level: Not on file  Occupational History   Not on file  Tobacco Use   Smoking status: Never   Smokeless tobacco: Never  Vaping Use  Vaping Use: Never used  Substance and Sexual Activity   Alcohol use: No   Drug use: No   Sexual activity: Yes    Partners: Male    Birth control/protection: Surgical    Comment: 1st intercourse- 18, partners- more than 5  Other Topics Concern   Not on file  Social History Narrative   Not on file   Social Determinants of Health   Financial Resource Strain: Not on file  Food Insecurity: Not on file  Transportation Needs: Not on file  Physical Activity: Not on file  Stress: Not on file  Social Connections: Not on file     Family History: The patient's family history includes Cancer in her mother and another family member; Diabetes in her maternal grandmother and mother; Hyperlipidemia in her maternal grandmother; Hypertension in an other family member.  ROS:   Please see the history of present illness.     No new data except that there is history of a heart murmur 15 years ago.  She thought they told her the heart murmur disappeared.  All other systems reviewed and are negative.  EKGs/Labs/Other Studies Reviewed:    The following studies were reviewed today:  ECHOCARDIOGRAM 09/22/2022 IMPRESSIONS   1. Left ventricular ejection fraction, by estimation, is 25 to 30%. The  left ventricle has severely decreased function. The left ventricle  demonstrates global hypokinesis. The left ventricular internal cavity size  was mildly dilated. Left ventricular  diastolic parameters are consistent with Grade III diastolic dysfunction  (restrictive).   2. Right ventricular systolic function is mildly reduced. The right  ventricular size is normal. There is moderately elevated pulmonary artery  systolic pressure.   3. The mitral valve is normal in structure. Severe mitral valve  regurgitation. No evidence of mitral stenosis.   4. The aortic valve is tricuspid. Aortic valve regurgitation is not  visualized. Aortic valve sclerosis is present, with no evidence of aortic  valve stenosis.   5. The inferior vena cava is dilated in size with >50% respiratory  variability, suggesting right atrial pressure of 8 mmHg.   Comparison(s): No prior Echocardiogram.   EKG:  EKG not repeated at  Recent Labs: 09/21/2022: NT-Pro BNP 11,752 09/25/2022: BUN 31; Creatinine, Ser 1.96; Potassium 4.2; Sodium 143  Recent Lipid Panel No results found for: "CHOL", "TRIG", "HDL", "CHOLHDL", "VLDL", "LDLCALC", "LDLDIRECT"  Physical Exam:    VS:  BP 136/86   Pulse (!) 114   Ht 5' 5.25" (1.657 m)   Wt 152 lb 9.6 oz (69.2 kg)   SpO2 98%   BMI 25.20 kg/m     Wt Readings from Last 3 Encounters:  09/26/22 152 lb 9.6 oz (69.2 kg)  09/21/22 156 lb 3.2 oz (70.9 kg)  05/11/22 161 lb (73 kg)     GEN: Healthy appearing. No acute distress HEENT: Normal NECK: No JVD. LYMPHATICS: No lymphadenopathy CARDIAC: 3/6  holosystolic mitral regurgitation murmur. RRR S3 gallop, with pedal ankle 2+ edema. VASCULAR:  Normal Pulses. No bruits. RESPIRATORY:  Clear to auscultation without rales, wheezing or rhonchi  ABDOMEN: Soft, non-tender, non-distended, No pulsatile mass, MUSCULOSKELETAL: No deformity  SKIN: Warm and dry NEUROLOGIC:  Alert and oriented x 3 PSYCHIATRIC:  Normal affect   ASSESSMENT:    1. Acute on chronic combined systolic and diastolic heart for   2. Stage 3b chronic kidney disease ([place])   3. Primary hypertension   4. Type 2 diabetes mellitus with complication, without long-term current use of insulin ([place])  5. Hyperlipidemia LDL goal <70    PLAN:    In order of problems listed above:  Discontinue losartan and start Entresto 24/26 mg p.o. twice daily.  Basic metabolic panel today.  Determine whether to continue furosemide 40 mg twice daily or decreased based upon kidney function.  Return in 1 week to start carvedilol 3.125 mg twice daily.  If heart rate is still above 100, would recommend starting only 3.125 mg once per day.  She should have a basic metabolic panel on return in 1 week.  She will then return to see me on January 5.  Further up titration of therapy will be contemplated at that time. Kidney function will be checked today with basic metabolic panel Blood pressure is improving on the current medical regimen Did not discuss Continue statin therapy   Refer to the advanced heart failure clinic hopefully within the first 2 to 3 weeks of January.   Medication Adjustments/Labs and Tests Ordered: Current medicines are reviewed at length with the patient today.  Concerns regarding medicines are outlined above.  Orders Placed This Encounter  Procedures   Basic metabolic panel   Basic metabolic panel   AMB referral to CHF clinic   Meds ordered this encounter  Medications   sacubitril-valsartan (ENTRESTO) 24-26 MG    Sig: Take 1 tablet by mouth 2 (two) times daily.     Dispense:  60 tablet    Refill:  11    Please Honor Card patient is presenting for Lindsay Quinn: [id number]; Juanna Cao: [id number]; BJYNW: [facility]; GNFA: [phone]    Patient Instructions  Medication Instructions:  Your physician has recommended you make the following change in your medication:   1) STOP Losartan 2) START Entresto 24-'26mg'$  twice daily  *Please call our office at [phone] to confirm the dose of furosemide (Lasix) you are taking.*  *If you need a refill on your cardiac medications before your next appointment, please call your pharmacy*  Lab Work: TODAY: BMET In 1 week: BMET (on 10/04/22) If you have labs (blood work) drawn today and your tests are completely normal, you will receive your results only by: [place] (if you have MyChart) OR A paper copy in the mail If you have any lab test that is abnormal or we need to change your treatment, we will call you to review the results.  Follow-Up: At [facility], you and your health needs are our priority.  As part of our continuing mission to provide you with exceptional heart care, we have created designated Provider Care Teams.  These Care Teams include your primary Cardiologist (physician) and Advanced Practice Providers (APPs -  Physician Assistants and Nurse Practitioners) who all work together to provide you with the care you need, when you need it.  Your next appointment:   1 week(s)  The format for your next appointment:   In Person  Provider:   Ambrose Pancoast, NP  Other Instructions Your physician has referred you to our advanced heart failure clinic to see either Dr. Haroldine Laws or Dr. Aundra Dubin. Their office will call you to schedule an appointment to see one of these providers.  Important Information About Sugar         Signed, Sinclair Grooms, MD  09/26/2022 4:26 PM    [facility]

## 2022-09-25 ENCOUNTER — Ambulatory Visit: Payer: Managed Care, Other (non HMO) | Attending: Interventional Cardiology

## 2022-09-25 DIAGNOSIS — I5042 Chronic combined systolic (congestive) and diastolic (congestive) heart failure: Secondary | ICD-10-CM

## 2022-09-25 DIAGNOSIS — I10 Essential (primary) hypertension: Secondary | ICD-10-CM

## 2022-09-25 LAB — BASIC METABOLIC PANEL
BUN/Creatinine Ratio: 16 (ref 12–28)
BUN: 31 mg/dL — ABNORMAL HIGH (ref 8–27)
CO2: 24 mmol/L (ref 20–29)
Calcium: 8.9 mg/dL (ref 8.7–10.3)
Chloride: 104 mmol/L (ref 96–106)
Creatinine, Ser: 1.96 mg/dL — ABNORMAL HIGH (ref 0.57–1.00)
Glucose: 144 mg/dL — ABNORMAL HIGH (ref 70–99)
Potassium: 4.2 mmol/L (ref 3.5–5.2)
Sodium: 143 mmol/L (ref 134–144)
eGFR: 28 mL/min/{1.73_m2} — ABNORMAL LOW (ref >59)

## 2022-09-26 ENCOUNTER — Ambulatory Visit: Payer: Managed Care, Other (non HMO) | Attending: Interventional Cardiology | Admitting: Interventional Cardiology

## 2022-09-26 ENCOUNTER — Encounter: Payer: Self-pay | Admitting: Interventional Cardiology

## 2022-09-26 VITALS — BP 136/86 | HR 114 | Ht 65.25 in | Wt 152.6 lb

## 2022-09-26 DIAGNOSIS — I5042 Chronic combined systolic (congestive) and diastolic (congestive) heart failure: Secondary | ICD-10-CM

## 2022-09-26 DIAGNOSIS — N1832 Chronic kidney disease, stage 3b: Secondary | ICD-10-CM | POA: Diagnosis not present

## 2022-09-26 DIAGNOSIS — E118 Type 2 diabetes mellitus with unspecified complications: Secondary | ICD-10-CM

## 2022-09-26 DIAGNOSIS — E785 Hyperlipidemia, unspecified: Secondary | ICD-10-CM

## 2022-09-26 DIAGNOSIS — I10 Essential (primary) hypertension: Secondary | ICD-10-CM

## 2022-09-26 MED ORDER — SACUBITRIL-VALSARTAN 24-26 MG PO TABS: 1.0000 | ORAL_TABLET | Freq: Two times a day (BID) | ORAL | 11 refills | Status: DC

## 2022-09-26 NOTE — Patient Instructions (Addendum)
Medication Instructions:  Your physician has recommended you make the following change in your medication:   1) STOP Losartan 2) START Entresto 24-'26mg'$  twice daily  *Please call our office at [phone] to confirm the dose of furosemide (Lasix) you are taking.*  *If you need a refill on your cardiac medications before your next appointment, please call your pharmacy*  Lab Work: TODAY: BMET In 1 week: BMET (on 10/04/22) If you have labs (blood work) drawn today and your tests are completely normal, you will receive your results only by: [place] (if you have MyChart) OR A paper copy in the mail If you have any lab test that is abnormal or we need to change your treatment, we will call you to review the results.  Follow-Up: At [facility], you and your health needs are our priority.  As part of our continuing mission to provide you with exceptional heart care, we have created designated Provider Care Teams.  These Care Teams include your primary Cardiologist (physician) and Advanced Practice Providers (APPs -  Physician Assistants and Nurse Practitioners) who all work together to provide you with the care you need, when you need it.  Your next appointment:   1 week(s)  The format for your next appointment:   In Person  Provider:   Ambrose Pancoast, NP  Other Instructions Your physician has referred you to our advanced heart failure clinic to see either Dr. Haroldine Laws or Dr. Aundra Dubin. Their office will call you to schedule an appointment to see one of these providers.  Important Information About Sugar

## 2022-09-27 LAB — BASIC METABOLIC PANEL
BUN/Creatinine Ratio: 16 (ref 12–28)
BUN: 33 mg/dL — ABNORMAL HIGH (ref 8–27)
CO2: 25 mmol/L (ref 20–29)
Calcium: 8.6 mg/dL — ABNORMAL LOW (ref 8.7–10.3)
Chloride: 104 mmol/L (ref 96–106)
Creatinine, Ser: 2.08 mg/dL — ABNORMAL HIGH (ref 0.57–1.00)
Glucose: 161 mg/dL — ABNORMAL HIGH (ref 70–99)
Potassium: 4.1 mmol/L (ref 3.5–5.2)
Sodium: 146 mmol/L — ABNORMAL HIGH (ref 134–144)
eGFR: 26 mL/min/{1.73_m2} — ABNORMAL LOW (ref >59)

## 2022-10-02 NOTE — Progress Notes (Addendum)
Office Visit    Patient Name: Lindsay Quinn Date of Encounter: 10/04/2022  Primary Care Provider:  Charlane Ferretti, MD Primary Cardiologist:  Sinclair Grooms, MD Primary Electrophysiologist: None  Chief Complaint    Lindsay Quinn is a 66 y.o. female with PMH of chronic combined CHF, breast CA, HLD, HTN, CKD stage III, DM II who presents for 1 week follow-up of CHF.  Past Medical History    Past Medical History:  Diagnosis Date   Breast cancer ([place]) 2002   left breast   Cancer ([place]) 1995   BREAST, LEFT   Chronic kidney disease    FOLLOWED BY MD   Complication of anesthesia    SLOW TO AWAKEN    Diabetes mellitus without complication ([place])    Gout    Hand paresthesia    History of adenomatous polyp of colon    Hyperlipidemia    Hypertension    Low blood potassium SINCE 1995 OR 1996   Type II diabetes mellitus with nephropathy ([place])    Past Surgical History:  Procedure Laterality Date   ABDOMINAL HYSTERECTOMY     PARTIAL   BREAST EXCISIONAL BIOPSY Right over 10+ yrs ago   benign   BREAST SURGERY Left 1995   LYMPH NODE REMOVED, AND CHEMO DONE   COLONOSCOPY WITH PROPOFOL N/A 05/08/2016   Procedure: COLONOSCOPY WITH PROPOFOL;  Surgeon: Garlan Fair, MD;  Location: [facility];  Service: Endoscopy;  Laterality: N/A;   MASTECTOMY Left 2002    Allergies  Allergies  Allergen Reactions   Clindamycin/Lincomycin Hives    History of Present Illness    Lindsay Quinn  is a 66year old female with the above mention past medical history who presents today for follow-up of chronic combined CHF.  Lindsay Quinn was seen by Dr. Tamala Julian on 09/21/2022 for new patient evaluation of chronic diastolic CHF.  She was noted to have elevated BNP and EKG was abnormal which led to CHF visit.  She had a nuclear stress test that was completed several years ago at [facility] with EF of 35-40%.  2D echo was updated during this visit that showed EF of 25-30%, with severely decreased LV  function, global hypokinesis, grade 3 DD, mildly reduced RV size with moderately elevated pulmonary artery pressure and severe mitral regurgitation.  Her Lasix was increased to 40 mg twice daily x 4 days with improvement in lower extremity swelling at subsequent visit.  She was also noted to have improvement in cough and no chest pain.  She did still have dyspnea on exertion.  Losartan was discontinued during visit and patient was started on Entresto with orders to continue Lasix 40 mg twice daily and GDMT optimized with carvedilol at next visit.  She was also referred to  Lindsay Quinn presents today for 1 week follow-up alone.  Since last being seen in the office patient reports she is doing much better with improvement and shortness of breath and lower extremity swelling.  Her blood pressure today is very soft at 108/64 with heart rate remaining elevated at 114 bpm. She reports full compliance with her medications and denies any adverse reactions or side effects.  She has trace edema bilaterally in her lower extremities and is otherwise euvolemic.  She denied any cough or dyspnea on exertion since her last visit.  During today's visit we discussed the titration plan of care for optimizing GDMT for CHF.  She expressed a full understanding of her care plan  moving forward.  I advised her that titration will depend on results of lab work and blood pressure control.  Patient denies chest pain, palpitations, dyspnea, PND, orthopnea, nausea, vomiting, dizziness, syncope, edema, weight gain, or early satiety.   Home Medications    Current Outpatient Medications  Medication Sig Dispense Refill   allopurinol (ZYLOPRIM) 100 MG tablet Take 100 mg by mouth daily.     atorvastatin (LIPITOR) 40 MG tablet Take 40 mg by mouth daily.     B Complex Vitamins (B COMPLEX PO) Take by mouth daily.     CALCIUM PO Take 1 tablet by mouth daily.     furosemide (LASIX) 40 MG tablet Take 1 tablet (40 mg total) by mouth 2 (two) times  daily. 60 tablet 0   JARDIANCE 10 MG TABS tablet Take 10 mg by mouth daily.     KLOR-CON M20 20 MEQ tablet Take 20 mEq by mouth daily.     metFORMIN (GLUCOPHAGE-XR) 500 MG 24 hr tablet Take 500 mg by mouth daily.     Multiple Vitamin (MULTIVITAMIN) tablet Take 1 tablet by mouth daily.     sacubitril-valsartan (ENTRESTO) 24-26 MG Take 1 tablet by mouth 2 (two) times daily. 60 tablet 11   No current facility-administered medications for this visit.     Review of Systems  Please see the history of present illness.    (+) Trace lower extremity edema  All other systems reviewed and are otherwise negative except as noted above.  Physical Exam    Wt Readings from Last 3 Encounters:  10/04/22 147 lb 12.8 oz (67 kg)  09/26/22 152 lb 9.6 oz (69.2 kg)  09/21/22 156 lb 3.2 oz (70.9 kg)   VS: Vitals:   10/04/22 1038  BP: 108/64  Pulse: (!) 114  SpO2: 97%  ,Body mass index is 24.41 kg/m.  Constitutional:      Appearance: Healthy appearance. Not in distress.  Neck:     Vascular: JVD normal.  Pulmonary:     Effort: Pulmonary effort is normal.     Breath sounds: No wheezing. No rales. Diminished in the bases Cardiovascular:     Normal rate. Regular rhythm. Normal S1. Normal S2.      Murmurs: There is no murmur.  Edema:    Peripheral edema absent.  Abdominal:     Palpations: Abdomen is soft non tender. There is no hepatomegaly.  Skin:    General: Skin is warm and dry.  Neurological:     General: No focal deficit present.     Mental Status: Alert and oriented to person, place and time.     Cranial Nerves: Cranial nerves are intact.  EKG/LABS/Other Studies Reviewed    ECG personally reviewed by me today -none completed today    No results found for: "WBC", "HGB", "HCT", "MCV", "PLT" Lab Results  Component Value Date   CREATININE 2.08 (H) 09/26/2022   BUN 33 (H) 09/26/2022   NA 146 (H) 09/26/2022   K 4.1 09/26/2022   CL 104 09/26/2022   CO2 25 09/26/2022   No results  found for: "ALT", "AST", "GGT", "ALKPHOS", "BILITOT" No results found for: "CHOL", "HDL", "LDLCALC", "LDLDIRECT", "TRIG", "CHOLHDL"  No results found for: "HGBA1C"  Assessment & Plan    1.  Chronic combined systolic and diastolic CHF: -2D echo recently completed with EF of 25-30% and grade 3 DD with severely decreased LV function and severe mitral regurgitation. -Today patient has improved volume status however blood pressures are soft due  to [place].  -She will document her blood pressures twice a day for 1 week with plans to add carvedilol if pressures are remaining above 124 systolic -Current GDMT optimized with Entresto 24/26 mg twice daily, Jardiance 10 mg daily, Lasix 40 mg twice daily -BMET today -Low sodium diet, fluid restriction <2L, and daily weights encouraged. Educated to contact our office for weight gain of 2 lbs overnight or 5 lbs in one week.   2.  Essential hypertension: -Blood pressure today is 108/64 -Unable to titrate or add medications at this time. -She will document blood pressures for 1 week pending titration.  3.  DM type II: -Patient's last hemoglobin A1c was 6.4 -Continue Jardiance 10 mg and metformin -Currently followed by PCP  4.  Hyperlipidemia: -Patient's last LDL cholesterol was 90 above goal of less than 70 -Continue atorvastatin 40 mg daily  5.  Stage IIIb CKD: -BMET to be drawn today and patient currently followed by [facility] for management.     Disposition: Follow-up with Belva Crome III, MD or APP in 1 week     Medication Adjustments/Labs and Tests Ordered: Current medicines are reviewed at length with the patient today.  Concerns regarding medicines are outlined above.   Signed, Mable Fill, Marissa Nestle, NP 10/04/2022, 11:59 AM [facility]  Note:  This document was prepared using Dragon voice recognition software and may include unintentional dictation errors.

## 2022-10-04 ENCOUNTER — Ambulatory Visit: Payer: Managed Care, Other (non HMO) | Admitting: Nurse Practitioner

## 2022-10-04 ENCOUNTER — Encounter: Payer: Self-pay | Admitting: Nurse Practitioner

## 2022-10-04 ENCOUNTER — Ambulatory Visit: Payer: Managed Care, Other (non HMO) | Attending: Nurse Practitioner

## 2022-10-04 VITALS — BP 108/64 | HR 114 | Ht 65.25 in | Wt 147.8 lb

## 2022-10-04 DIAGNOSIS — I5042 Chronic combined systolic (congestive) and diastolic (congestive) heart failure: Secondary | ICD-10-CM

## 2022-10-04 DIAGNOSIS — N1832 Chronic kidney disease, stage 3b: Secondary | ICD-10-CM

## 2022-10-04 DIAGNOSIS — E118 Type 2 diabetes mellitus with unspecified complications: Secondary | ICD-10-CM | POA: Diagnosis not present

## 2022-10-04 DIAGNOSIS — E785 Hyperlipidemia, unspecified: Secondary | ICD-10-CM | POA: Diagnosis not present

## 2022-10-04 DIAGNOSIS — I10 Essential (primary) hypertension: Secondary | ICD-10-CM

## 2022-10-04 LAB — BASIC METABOLIC PANEL
BUN/Creatinine Ratio: 16 (ref 12–28)
BUN: 28 mg/dL — ABNORMAL HIGH (ref 8–27)
CO2: 27 mmol/L (ref 20–29)
Calcium: 8.8 mg/dL (ref 8.7–10.3)
Chloride: 99 mmol/L (ref 96–106)
Creatinine, Ser: 1.78 mg/dL — ABNORMAL HIGH (ref 0.57–1.00)
Glucose: 246 mg/dL — ABNORMAL HIGH (ref 70–99)
Potassium: 3.4 mmol/L — ABNORMAL LOW (ref 3.5–5.2)
Sodium: 143 mmol/L (ref 134–144)
eGFR: 31 mL/min/{1.73_m2} — ABNORMAL LOW (ref >59)

## 2022-10-04 NOTE — Patient Instructions (Signed)
Medication Instructions:  Your physician recommends that you continue on your current medications as directed. Please refer to the Current Medication list given to you today.  *If you need a refill on your cardiac medications before your next appointment, please call your pharmacy*  Lab Work: TODAY: BMET If you have labs (blood work) drawn today and your tests are completely normal, you will receive your results only by: [place] (if you have MyChart) OR A paper copy in the mail If you have any lab test that is abnormal or we need to change your treatment, we will call you to review the results.  Follow-Up: At [facility], you and your health needs are our priority.  As part of our continuing mission to provide you with exceptional heart care, we have created designated Provider Care Teams.  These Care Teams include your primary Cardiologist (physician) and Advanced Practice Providers (APPs -  Physician Assistants and Nurse Practitioners) who all work together to provide you with the care you need, when you need it.  Your next appointment:   1 week(s) on 10/13/2022 at 9:30AM  The format for your next appointment:   In Person  Provider:   Sinclair Grooms, MD   Other Instructions Please monitor your blood pressure at home over the next week and send a list of your readings via MyChart or call them in to our office at [phone].  Important Information About Sugar

## 2022-10-10 ENCOUNTER — Telehealth: Payer: Self-pay | Admitting: Interventional Cardiology

## 2022-10-10 DIAGNOSIS — I10 Essential (primary) hypertension: Secondary | ICD-10-CM

## 2022-10-10 MED ORDER — CARVEDILOL 3.125 MG PO TABS: ORAL_TABLET | ORAL | 3 refills | Status: DC

## 2022-10-10 MED ORDER — FUROSEMIDE 40 MG PO TABS: 40.0000 mg | ORAL_TABLET | Freq: Every day | ORAL | 3 refills | Status: DC

## 2022-10-10 NOTE — Telephone Encounter (Signed)
Spoke with patient and discussed lab results.  Per Dr. Tamala Julian: Let the patient know the labs look better. Decrease the furosemide to 40 mg daily (no second dose today if not yet taken) Start Carvedilol 3.125 mg daily tomorrow AM for two days and if feeling okay increase to BID starting 10/09/22. BMET on Wednesday 10/11/22. F/u with me on Friday 10/13/22.   Patient will not be able to pick-up new medication Carvedilol until tomorrow afternoon, will start Thursday morning.  Carvedilol 3.'125mg'$  QD sent to pharmacy.  Patient will come in for appt with Dr. Tamala Julian on 10/13/2022 with BMET this day.  Patient verbalized understanding of the above.

## 2022-10-10 NOTE — Telephone Encounter (Signed)
-----   Message from Belva Crome, MD sent at 10/06/2022  1:54 PM EST ----- Let the patient know the labs look better. Decrease the furosemide to 40 mg daily (no second dose today if not yet taken) Start Carvedilol 3.125 mg daily tomorrow AM for two days and if feeling okay increase to BID starting 10/09/22. BMET on Wednesday 10/11/22. F/u with me on Friday 10/13/22. A copy will be sent to Stacie Glaze, DO

## 2022-10-12 ENCOUNTER — Telehealth: Payer: Self-pay | Admitting: Interventional Cardiology

## 2022-10-12 NOTE — Telephone Encounter (Signed)
Called pt left a message that MD is not in the office today.  Will send BP readings to provider to be reviewed upon return.

## 2022-10-12 NOTE — Progress Notes (Signed)
Cardiology Office Note:    Date:  10/13/2022   ID:  Lindsay Quinn, DOB 12/09/1955, MRN [id number]  PCP:  Lindsay Ferretti, MD  Cardiologist:  Lindsay Grooms, MD   Referring MD: Lindsay Ferretti, MD   Chief Complaint  Patient presents with   Congestive Heart Failure    History of Present Illness:    Lindsay Quinn is a 67 y.o. female with a hx of  stage III b CKD, primary hypertension, hyperlipidemia, type 2 diabetes mellitus, history of breast cancer (left), and acute on chronic combined systolic and diastolic HF.   Returns today for f/u with plan to further adjust therapy depending upon how she is doing. I have been concerned about the elevated HR. she did start carvedilol 3.125 mg daily without difficulty.  Heart rate is slightly slower at 104 bpm.  Lower extremity swelling has completely resolved.  She has 3 to 4 cm of JVD lying at 30 degrees.  Neck veins were previously visible while sitting from across the room.  She and husband have questions about making certain that all doctors are in the loop on treatment and medication changes.  She has a nephrologist who is Lindsay Quinn at [facility].  The patient is followed because of stage IIIb CKD.  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  Past Medical History:  Diagnosis Date   Breast cancer ([place]) 2002   left breast   Cancer ([place]) 1995   BREAST, LEFT   Chronic kidney disease    FOLLOWED BY MD   Complication of anesthesia    SLOW TO AWAKEN    Diabetes mellitus without complication ([place])    Gout    Hand paresthesia    History of adenomatous polyp of colon    Hyperlipidemia    Hypertension    Low blood potassium SINCE 1995 OR 1996   Type II diabetes mellitus with nephropathy ([place])     Past Surgical History:  Procedure Laterality Date   ABDOMINAL HYSTERECTOMY     PARTIAL   BREAST EXCISIONAL BIOPSY Right over 10+ yrs ago   benign   BREAST SURGERY Left 1995   LYMPH NODE REMOVED, AND CHEMO DONE    COLONOSCOPY WITH PROPOFOL N/A 05/08/2016   Procedure: COLONOSCOPY WITH PROPOFOL;  Surgeon: Garlan Fair, MD;  Location: [facility];  Service: Endoscopy;  Laterality: N/A;   MASTECTOMY Left 2002    Current Medications: Current Meds  Medication Sig   allopurinol (ZYLOPRIM) 100 MG tablet Take 100 mg by mouth daily.   Ascorbic Acid (VITA-C PO) Take 1 tablet by mouth daily at 6 (six) AM.   atorvastatin (LIPITOR) 40 MG tablet Take 40 mg by mouth daily.   B Complex Vitamins (B COMPLEX PO) Take by mouth daily.   CALCIUM PO Take 1 tablet by mouth daily.   carvedilol (COREG) 3.125 MG tablet Take 1 tablet (3.125 mg total) by mouth 2 (two) times daily.   furosemide (LASIX) 40 MG tablet Take 1 tablet (40 mg total) by mouth daily.   JARDIANCE 10 MG TABS tablet Take 10 mg by mouth daily.   KLOR-CON M20 20 MEQ tablet Take 20 mEq by mouth daily.   metFORMIN (GLUCOPHAGE-XR) 500 MG 24 hr tablet Take 500 mg by mouth daily.   Multiple Vitamin (MULTIVITAMIN) tablet Take 1 tablet by mouth daily.   sacubitril-valsartan (ENTRESTO) 24-26 MG Take 1 tablet by mouth 2 (two) times daily.   [DISCONTINUED] carvedilol (COREG) 3.125 MG tablet  Take 1 tablet (3.'125mg'$  total) by mouth daily for 2 days, then if tolerating well increase to 1 tablet by mouth twice daily.     Allergies:   Clindamycin/lincomycin   Social History   Socioeconomic History   Marital status: Married    Spouse name: Not on file   Number of children: Not on file   Years of education: Not on file   Highest education level: Not on file  Occupational History   Not on file  Tobacco Use   Smoking status: Never   Smokeless tobacco: Never  Vaping Use   Vaping Use: Never used  Substance and Sexual Activity   Alcohol use: No   Drug use: No   Sexual activity: Yes    Partners: Male    Birth control/protection: Surgical    Comment: 1st intercourse- 18, partners- more than 5  Other Topics Concern   Not on file  Social History Narrative    Not on file   Social Determinants of Health   Financial Resource Strain: Not on file  Food Insecurity: Not on file  Transportation Needs: Not on file  Physical Activity: Not on file  Stress: Not on file  Social Connections: Not on file     Family History: The patient's family history includes Cancer in her mother and another family member; Diabetes in her maternal grandmother and mother; Hyperlipidemia in her maternal grandmother; Hypertension in an other family member.  ROS:   Please see the history of present illness.    Improved.  Able to lie down in bed.  Cough has resolved.  All other systems reviewed and are negative.  EKGs/Labs/Other Studies Reviewed:    The following studies were reviewed today: The most recent laboratory data revealed a creatinine of 1.78, 8 days ago.  Potassium at that time was 3.4.  EKG:  EKG not repeated.  Recent Labs: 09/21/2022: NT-Pro BNP 11,752 10/04/2022: BUN 28; Creatinine, Ser 1.78; Potassium 3.4; Sodium 143  Recent Lipid Panel No results found for: "CHOL", "TRIG", "HDL", "CHOLHDL", "VLDL", "LDLCALC", "LDLDIRECT"  Physical Exam:    VS:  BP 110/71   Pulse (!) 104   Ht 5' 5.25" (1.657 m)   Wt 149 lb 3.2 oz (67.7 kg)   SpO2 96%   BMI 24.64 kg/m     Wt Readings from Last 3 Encounters:  10/13/22 149 lb 3.2 oz (67.7 kg)  10/04/22 147 lb 12.8 oz (67 kg)  09/26/22 152 lb 9.6 oz (69.2 kg)   When initially seen, her weight was 156 pounds.  Weight today is 149 pounds up by 2 pounds from October 04, 2022.  GEN: Stable. No acute distress HEENT: Normal NECK: No JVD. LYMPHATICS: No lymphadenopathy CARDIAC: No murmur. RRR S4 but no S3 gallop, or edema. VASCULAR:  Normal Pulses. No bruits. RESPIRATORY:  Clear to auscultation without rales, wheezing or rhonchi  ABDOMEN: Soft, non-tender, non-distended, No pulsatile mass, MUSCULOSKELETAL: No deformity  SKIN: Warm and dry NEUROLOGIC:  Alert and oriented x 3 PSYCHIATRIC:  Normal affect    ASSESSMENT:    1. Acute on chronic combined systolic and diastolic heart for   2. Primary hypertension   3. Stage 3b chronic kidney disease ([place])   4. Type 2 diabetes mellitus with complication, without long-term current use of insulin ([place])   5. Hyperlipidemia LDL goal <70    PLAN:    In order of problems listed above:  Plan was to increase carvedilol to 3.125 mg twice daily.  If blood pressure  does not tolerate up titration, may have to use [place].  Basic metabolic panel will be obtained today.  She has gained 2 pounds since decreasing furosemide to 40 mg/day.  Will need to see what creatinine is and determine if it would be safe to add spironolactone.  She needs to return in 2 weeks for reassessment on higher dose carvedilol and any other changes that are made after today's laboratory data is seen.  I have requested that she see the advanced heart failure team.  No word yet from them on when the patient might be seen.  I have requested that the patient be assigned to Dr. Candee Furbish. Blood pressure is much improved on the current heart failure regimen which includes Entresto, Jardiance, and carvedilol. Lindsay Quinn is her primary nephrologist and certainly will help [place] decide which medications can be used safely, i.e. MRA or not.  Guideline directed therapy for left ventricular systolic dysfunction: Angiotensin receptor-neprilysin inhibitor (ARNI)-Entresto; beta-blocker therapy - carvedilol, metoprolol succinate, or bisoprolol; mineralocorticoid receptor antagonist (MRA) therapy -spironolactone or eplerenone.  SGLT-2 agents -  Dapagliflozin [place]) or Empagliflozin (Jardiance).These therapies have been shown to improve clinical outcomes including reduction of rehospitalization, survival, and acute heart failure.    Medication Adjustments/Labs and Tests Ordered: Current medicines are reviewed at length with the patient today.  Concerns regarding medicines are outlined above.  Orders  Placed This Encounter  Procedures   Basic metabolic panel   Basic metabolic panel   Meds ordered this encounter  Medications   carvedilol (COREG) 3.125 MG tablet    Sig: Take 1 tablet (3.125 mg total) by mouth 2 (two) times daily.    Dispense:  180 tablet    Refill:  3    Dose change (increased frequency to twice daily)    Patient Instructions  Medication Instructions:  Your physician has recommended you make the following change in your medication:   1) INCREASE carvedilol (Coreg) 3.'125mg'$  to twice daily  *If you need a refill on your cardiac medications before your next appointment, please call your pharmacy*  Lab Work: TODAY: BMET In 2-3 weeks (same day of next OV): BMET  If you have labs (blood work) drawn today and your tests are completely normal, you will receive your results only by: [place] (if you have MyChart) OR A paper copy in the mail If you have any lab test that is abnormal or we need to change your treatment, we will call you to review the results.  Testing/Procedures: NONE  Follow-Up: At [facility], you and your health needs are our priority.  As part of our continuing mission to provide you with exceptional heart care, we have created designated Provider Care Teams.  These Care Teams include your primary Cardiologist (physician) and Advanced Practice Providers (APPs -  Physician Assistants and Nurse Practitioners) who all work together to provide you with the care you need, when you need it.  Your next appointment:   2-3 week(s)  The format for your next appointment:   In Person  Provider:   Candee Furbish, MD or Ambrose Pancoast, NP, Christen Bame, NP, or Richardson Dopp, PA-C  Important Information About Sugar         Signed, Lindsay Grooms, MD  10/13/2022 10:10 AM    [place]

## 2022-10-12 NOTE — Telephone Encounter (Signed)
Calling with her bp reading  12/28: 122/85 HR 106 12//30: 128/91 HR 115 (am)   121/83 HR 107 (pm) 12/31: 129/88 HR 109 (am)  127/87 HR 110 (pm) 1/1: 134/88 HR 111 (am)        130/91 HR 109 (pm) 1/2: 127/83 HR 110 (am)        127/85 HR 106 (pm) 1/3: 123/74 HR 105 (am)         116/86 HR 113 (pm)

## 2022-10-13 ENCOUNTER — Ambulatory Visit: Payer: Managed Care, Other (non HMO) | Attending: Interventional Cardiology | Admitting: Interventional Cardiology

## 2022-10-13 ENCOUNTER — Encounter: Payer: Self-pay | Admitting: Interventional Cardiology

## 2022-10-13 VITALS — BP 110/71 | HR 104 | Ht 65.25 in | Wt 149.2 lb

## 2022-10-13 DIAGNOSIS — N1832 Chronic kidney disease, stage 3b: Secondary | ICD-10-CM | POA: Diagnosis not present

## 2022-10-13 DIAGNOSIS — E118 Type 2 diabetes mellitus with unspecified complications: Secondary | ICD-10-CM | POA: Diagnosis not present

## 2022-10-13 DIAGNOSIS — I10 Essential (primary) hypertension: Secondary | ICD-10-CM | POA: Diagnosis not present

## 2022-10-13 DIAGNOSIS — I5042 Chronic combined systolic (congestive) and diastolic (congestive) heart failure: Secondary | ICD-10-CM | POA: Diagnosis not present

## 2022-10-13 DIAGNOSIS — E785 Hyperlipidemia, unspecified: Secondary | ICD-10-CM

## 2022-10-13 LAB — BASIC METABOLIC PANEL
BUN/Creatinine Ratio: 16 (ref 12–28)
BUN: 32 mg/dL — ABNORMAL HIGH (ref 8–27)
CO2: 25 mmol/L (ref 20–29)
Calcium: 9.7 mg/dL (ref 8.7–10.3)
Chloride: 100 mmol/L (ref 96–106)
Creatinine, Ser: 1.96 mg/dL — ABNORMAL HIGH (ref 0.57–1.00)
Glucose: 103 mg/dL — ABNORMAL HIGH (ref 70–99)
Potassium: 4.3 mmol/L (ref 3.5–5.2)
Sodium: 140 mmol/L (ref 134–144)
eGFR: 28 mL/min/{1.73_m2} — ABNORMAL LOW (ref >59)

## 2022-10-13 MED ORDER — CARVEDILOL 3.125 MG PO TABS: 3.1250 mg | ORAL_TABLET | Freq: Two times a day (BID) | ORAL | 3 refills | Status: DC

## 2022-10-13 NOTE — Patient Instructions (Signed)
Medication Instructions:  Your physician has recommended you make the following change in your medication:   1) INCREASE carvedilol (Coreg) 3.'125mg'$  to twice daily  *If you need a refill on your cardiac medications before your next appointment, please call your pharmacy*  Lab Work: TODAY: BMET In 2-3 weeks (same day of next OV): BMET  If you have labs (blood work) drawn today and your tests are completely normal, you will receive your results only by: [place] (if you have MyChart) OR A paper copy in the mail If you have any lab test that is abnormal or we need to change your treatment, we will call you to review the results.  Testing/Procedures: NONE  Follow-Up: At [facility], you and your health needs are our priority.  As part of our continuing mission to provide you with exceptional heart care, we have created designated Provider Care Teams.  These Care Teams include your primary Cardiologist (physician) and Advanced Practice Providers (APPs -  Physician Assistants and Nurse Practitioners) who all work together to provide you with the care you need, when you need it.  Your next appointment:   2-3 week(s)  The format for your next appointment:   In Person  Provider:   Candee Furbish, MD or Ambrose Pancoast, NP, Christen Bame, NP, or Richardson Dopp, PA-C  Important Information About Sugar

## 2022-10-16 ENCOUNTER — Telehealth (HOSPITAL_COMMUNITY): Payer: Self-pay | Admitting: Vascular Surgery

## 2022-10-16 NOTE — Telephone Encounter (Signed)
Lvm to make pt new chf appt w/ AD

## 2022-10-17 NOTE — Telephone Encounter (Signed)
LVM to make new chf appt w/ AD next ava

## 2022-10-20 NOTE — Progress Notes (Deleted)
Cardiology Office Note:    Date:  10/20/2022   ID:  Lindsay Quinn, DOB 1956-02-08, MRN [id number]  PCP:  Lindsay Ferretti, MD   [facility] Providers Cardiologist:  Lindsay Furbish, MD { Click to update primary MD,subspecialty MD or APP then REFRESH:1}    Referring MD: Lindsay Ferretti, MD   Chief Complaint: ***  History of Present Illness:    Lindsay Quinn is a *** 67 y.o. female with a hx of CKD stage 3b, HTN, HLD, type 2 DM, left breast cancer, and acute on chronic combined systolic and diastolic CHF.   CKD is followed by Lindsay Quinn at [facility].   Referred to cardiology and seen by Lindsay Quinn on 09/21/22 for history of chronic HFpEF. She reported a 2 month history of cough with accompanying symptoms of orthopnea, not improved on antibiotics and cough suppressants. Elevated BNP and abnormal echo led to concern for heart failure. In reviewing records from [place], Lindsay Quinn found nuclear study demonstrated EF 35-40% several years ago. Recent echo report suggesting global hypokinesis but estimated EF 51% and mention of diastolic dysfunction. Lasix was started and improved her clinical status but continued to have significant lower extremity swelling and cough when recumbent and when sitting. Longstanding hx of elevated BP. Prior hx of breast CA with chemo. No history of ischemia. CKD of unknown duration. EKG demonstrated poor R wave progression, which may indicate silent anterior infarct. She was advised to increase furosemide to 40 mg twice daily for the next 3-4 days, weigh daily, and BNP and BMP were being obtained for further medication adjustment. BNP was > 11,000. She was already on Jardiance 10 mg daily.   She returned to the office on 12/19 for follow-up with Lindsay Quinn. LE edema and cough were improved. Continued to have DOE. 2D echo 09/22/22 revealed biventricular failure much worse than on echo from [place]. Losartan was d/ced and she was started on Entresto 24-26 mg  BID.   Returned 10/04/22, seen by Lindsay Pancoast, NP. BP was soft at 108/64, HR remained elevated at 114 bpm. She was advised to monitor BP and HR for 1 week and if SBP > 120 mmHg plan to add carvedilol. Following lab results, she was advised to decrease furosemide to 40 mg daily and start carvedilol 3.125 mg AM only for 2 days then if feeling ok, increase to twice daily.   Last cardiology clinic visit was 10/13/22 with Lindsay Quinn at which time he felt volume status was improved with LE swelling resolved, and JVD 3-4 cm lying at 30 degrees, no longer visible across the room. She had continued carvedilol 3.125 mg once daily. Weight gain of 2 lbs since decreasing furosemide. Repeat BMET revealed Scr slightly worse at 1.96 (up from 1.78), continue furosemide 40 mg daily and carefully monitor weight. She has been referred to AHF clinic and has appointment 11/09/22 with Lindsay Quinn.  Today,   Past Medical History:  Diagnosis Date   Breast cancer ([place]) 2002   left breast   Cancer ([place]) 1995   BREAST, LEFT   Chronic kidney disease    FOLLOWED BY MD   Complication of anesthesia    SLOW TO AWAKEN    Diabetes mellitus without complication ([place])    Gout    Hand paresthesia    History of adenomatous polyp of colon    Hyperlipidemia    Hypertension    Low blood potassium [place]   Type II  diabetes mellitus with nephropathy ([place])     Past Surgical History:  Procedure Laterality Date   ABDOMINAL HYSTERECTOMY     PARTIAL   BREAST EXCISIONAL BIOPSY Right over 10+ yrs ago   benign   BREAST SURGERY Left 1995   LYMPH NODE REMOVED, AND CHEMO DONE   COLONOSCOPY WITH PROPOFOL N/A 05/08/2016   Procedure: COLONOSCOPY WITH PROPOFOL;  Surgeon: Garlan Fair, MD;  Location: [facility] END[facility]COPY;  Service: Endoscopy;  Laterality: N/A;   MASTECTOMY Left 2002    Current Medications: No outpatient medications have been marked as taking for the 10/25/22 encounter (Appointment) with Lindsay Quinn, Lindsay Schwab, NP.      Allergies:   Clindamycin/lincomycin   Social History   Socioeconomic History   Marital status: Married    Spouse name: Not on file   Number of children: Not on file   Years of education: Not on file   Highest education level: Not on file  Occupational History   Not on file  Tobacco Use   Smoking status: Never   Smokeless tobacco: Never  Vaping Use   Vaping Use: Never used  Substance and Sexual Activity   Alcohol use: No   Drug use: No   Sexual activity: Yes    Partners: Male    Birth control/protection: Surgical    Comment: 1st intercourse- 18, partners- more than 5  Other Topics Concern   Not on file  Social History Narrative   Not on file   Social Determinants of Health   Financial Resource Strain: Not on file  Food Insecurity: Not on file  Transportation Needs: Not on file  Physical Activity: Not on file  Stress: Not on file  Social Connections: Not on file     Family History: The patient's ***family history includes Cancer in her mother and another family member; Diabetes in her maternal grandmother and mother; Hyperlipidemia in her maternal grandmother; Hypertension in an other family member.  ROS:   Please see the history of present illness.    *** All other systems reviewed and are negative.  Labs/Other Studies Reviewed:    The following studies were reviewed today:  Echo 09/22/22 1. Left ventricular ejection fraction, by estimation, is 25 to 30%. The  left ventricle has severely decreased function. The left ventricle  demonstrates global hypokinesis. The left ventricular internal cavity size  was mildly dilated. Left ventricular  diastolic parameters are consistent with Grade III diastolic dysfunction  (restrictive).   2. Right ventricular systolic function is mildly reduced. The right  ventricular size is normal. There is moderately elevated pulmonary artery  systolic pressure.   3. The mitral valve is normal in structure. Severe mitral valve   regurgitation. No evidence of mitral stenosis.   4. The aortic valve is tricuspid. Aortic valve regurgitation is not  visualized. Aortic valve sclerosis is present, with no evidence of aortic  valve stenosis.   5. The inferior vena cava is dilated in size with >50% respiratory  variability, suggesting right atrial pressure of 8 mmHg.   Comparison(s): No prior Echocardiogram.   Exercise Myoview 08/12/21 Findings are consistent with no prior ischemia and no prior myocardial infarction. The study is low risk.   No ST deviation was noted.   LV perfusion is normal. There is no evidence of ischemia. There is no evidence of infarction.   Left ventricular function is normal. Nuclear stress EF: 41 %. The left ventricular ejection fraction is moderately decreased (30-44%). End diastolic cavity size is normal. End  systolic cavity size is normal.   Prior study not available for comparison.   Coinsider an echo to better assess wall motion and systolic function.  Recent Labs: 09/21/2022: NT-Pro BNP 11,752 10/13/2022: BUN 32; Creatinine, Ser 1.96; Potassium 4.3; Sodium 140  Recent Lipid Panel No results found for: "CHOL", "TRIG", "HDL", "CHOLHDL", "VLDL", "LDLCALC", "LDLDIRECT"   Risk Assessment/Calculations:   {Does this patient have ATRIAL FIBRILLATION?:[id number]}       Physical Exam:    VS:  There were no vitals taken for this visit.    Wt Readings from Last 3 Encounters:  10/13/22 149 lb 3.2 oz (67.7 kg)  10/04/22 147 lb 12.8 oz (67 kg)  09/26/22 152 lb 9.6 oz (69.2 kg)     GEN: *** Well nourished, well developed in no acute distress HEENT: Normal NECK: No JVD; No carotid bruits CARDIAC: ***RRR, no murmurs, rubs, gallops RESPIRATORY:  Clear to auscultation without rales, wheezing or rhonchi  ABDOMEN: Soft, non-tender, non-distended MUSCULOSKELETAL:  No edema; No deformity. *** pedal pulses, ***bilaterally SKIN: Warm and dry NEUROLOGIC:  Alert and oriented x 3 PSYCHIATRIC:  Normal  affect   EKG:  EKG is *** ordered today.  The ekg ordered today demonstrates ***  No BP recorded.  {Refresh Note OR Click here to enter BP  :1}***    Diagnoses:    No diagnosis found. Assessment and Plan:     Acute on chronic combined CHF: Hypertension:   {Are you ordering a CV Procedure (e.g. stress test, cath, DCCV, TEE, etc)?   Press F2        :[id number]   Disposition:  Medication Adjustments/Labs and Tests Ordered: Current medicines are reviewed at length with the patient today.  Concerns regarding medicines are outlined above.  No orders of the defined types were placed in this encounter.  No orders of the defined types were placed in this encounter.   There are no Patient Instructions on file for this visit.   Signed, Emmaline Life, NP  10/20/2022 11:53 AM    [place]

## 2022-10-25 ENCOUNTER — Other Ambulatory Visit: Payer: Managed Care, Other (non HMO)

## 2022-10-25 ENCOUNTER — Ambulatory Visit: Payer: Managed Care, Other (non HMO) | Admitting: Nurse Practitioner

## 2022-10-26 ENCOUNTER — Ambulatory Visit: Payer: Managed Care, Other (non HMO) | Admitting: Nurse Practitioner

## 2022-10-26 ENCOUNTER — Other Ambulatory Visit: Payer: Managed Care, Other (non HMO)

## 2022-11-09 ENCOUNTER — Ambulatory Visit (HOSPITAL_COMMUNITY)
Admission: RE | Admit: 2022-11-09 | Discharge: 2022-11-09 | Disposition: A | Discharge status: Home / Self Care | Payer: Managed Care, Other (non HMO) | Source: Ambulatory Visit | Attending: Internal Medicine | Admitting: Internal Medicine

## 2022-11-09 ENCOUNTER — Encounter (HOSPITAL_COMMUNITY): Payer: Self-pay | Admitting: Internal Medicine

## 2022-11-09 VITALS — BP 110/70 | HR 92 | Wt 150.2 lb

## 2022-11-09 DIAGNOSIS — N184 Chronic kidney disease, stage 4 (severe): Secondary | ICD-10-CM

## 2022-11-09 DIAGNOSIS — N1832 Chronic kidney disease, stage 3b: Secondary | ICD-10-CM | POA: Diagnosis not present

## 2022-11-09 DIAGNOSIS — I5042 Chronic combined systolic (congestive) and diastolic (congestive) heart failure: Secondary | ICD-10-CM | POA: Diagnosis not present

## 2022-11-09 DIAGNOSIS — I129 Hypertensive chronic kidney disease with stage 1 through stage 4 chronic kidney disease, or unspecified chronic kidney disease: Secondary | ICD-10-CM

## 2022-11-09 DIAGNOSIS — E1122 Type 2 diabetes mellitus with diabetic chronic kidney disease: Secondary | ICD-10-CM | POA: Insufficient documentation

## 2022-11-09 DIAGNOSIS — E118 Type 2 diabetes mellitus with unspecified complications: Secondary | ICD-10-CM

## 2022-11-09 DIAGNOSIS — Z79899 Other long term (current) drug therapy: Secondary | ICD-10-CM | POA: Insufficient documentation

## 2022-11-09 DIAGNOSIS — I130 Hypertensive heart and chronic kidney disease with heart failure and stage 1 through stage 4 chronic kidney disease, or unspecified chronic kidney disease: Secondary | ICD-10-CM | POA: Diagnosis present

## 2022-11-09 DIAGNOSIS — Z853 Personal history of malignant neoplasm of breast: Secondary | ICD-10-CM | POA: Insufficient documentation

## 2022-11-09 DIAGNOSIS — I5022 Chronic systolic (congestive) heart failure: Secondary | ICD-10-CM | POA: Insufficient documentation

## 2022-11-09 LAB — BASIC METABOLIC PANEL
Anion gap: 9 (ref 5–15)
BUN: 34 mg/dL — ABNORMAL HIGH (ref 8–23)
CO2: 25 mmol/L (ref 22–32)
Calcium: 9.2 mg/dL (ref 8.9–10.3)
Chloride: 104 mmol/L (ref 98–111)
Creatinine, Ser: 2.13 mg/dL — ABNORMAL HIGH (ref 0.44–1.00)
GFR, Estimated: 25 mL/min — ABNORMAL LOW (ref >60)
Glucose, Bld: 82 mg/dL (ref 70–99)
Potassium: 3.8 mmol/L (ref 3.5–5.1)
Sodium: 138 mmol/L (ref 135–145)

## 2022-11-09 LAB — CBC
HCT: 38.7 % (ref 36.0–46.0)
Hemoglobin: 12.1 g/dL (ref 12.0–15.0)
MCH: 25.2 pg — ABNORMAL LOW (ref 26.0–34.0)
MCHC: 31.3 g/dL (ref 30.0–36.0)
MCV: 80.5 fL (ref 80.0–100.0)
Platelets: 224 10*3/uL (ref 150–400)
RBC: 4.81 MIL/uL (ref 3.87–5.11)
RDW: 18.9 % — ABNORMAL HIGH (ref 11.5–15.5)
WBC: 5.9 10*3/uL (ref 4.0–10.5)
nRBC: 0 % (ref 0.0–0.2)

## 2022-11-09 LAB — BRAIN NATRIURETIC PEPTIDE: B Natriuretic Peptide: 981 pg/mL — ABNORMAL HIGH (ref 0.0–100.0)

## 2022-11-09 MED ORDER — SPIRONOLACTONE 25 MG PO TABS: 12.5000 mg | ORAL_TABLET | Freq: Every day | ORAL | 3 refills | Status: DC

## 2022-11-09 NOTE — Progress Notes (Signed)
ReDS Vest / Clip - 11/09/22 1500       ReDS Vest / Clip   Station Marker A    Ruler Value 33    ReDS Value Range Moderate volume overload    ReDS Actual Value 39

## 2022-11-09 NOTE — Patient Instructions (Addendum)
START Spironolactone 12.5 mg ( 1/2 Tab) daily.  Labs done today, your results will be available in MyChart, we will contact you for abnormal readings.  Your physician has requested that you have a cardiac MRI. Cardiac MRI uses a computer to create images of your heart as its beating, producing both still and moving pictures of your heart and major blood vessels. For further information please visit [email or web address] Please follow the instruction sheet given to you today for more information. ONCE APPROVED BY  YOUR INSURANCE COMPANY YOU WILL BE CALLED TO HAVE THE TEST ARRANGED.  Your physician recommends that you schedule a follow-up appointment in: April **please call the office in March to arrange your follow up appointment.**  If you have any questions or concerns before your next appointment please send [place] a message through [place] or call our office at [phone].    TO LEAVE A MESSAGE FOR THE NURSE SELECT OPTION 2, PLEASE LEAVE A MESSAGE INCLUDING: YOUR NAME DATE OF BIRTH CALL BACK NUMBER REASON FOR CALL**this is important as we prioritize the call backs  YOU WILL RECEIVE A CALL BACK THE SAME DAY AS LONG AS YOU CALL BEFORE 4:00 PM  At the [facility], you and your health needs are our priority. As part of our continuing mission to provide you with exceptional heart care, we have created designated Provider Care Teams. These Care Teams include your primary Cardiologist (physician) and Advanced Practice Providers (APPs- Physician Assistants and Nurse Practitioners) who all work together to provide you with the care you need, when you need it.   You may see any of the following providers on your designated Care Team at your next follow up: Dr Glori Bickers Dr Loralie Champagne Dr. Roxana Hires, NP Lyda Jester, [place] [facility] [place], [place] Forestine Na, NP Audry Riles, PharmD   Please be sure to bring in all your medications bottles  to every appointment.    Thank you for choosing [facility]

## 2022-11-09 NOTE — Progress Notes (Signed)
ADVANCED HF CLINIC CONSULT NOTE  Referring Physician: Charlane Ferretti, MD Primary Care: Charlane Ferretti, MD Primary Cardiologist: Candee Furbish, MD  Nephrology: Dr. 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 Marlou Porch for further evaluation of her HF    Was treated for breast cancer in 1995. Had surgery and chemo (does not remember which chemo).  Had Myoview in 11/22 for CP. EF 41%  No ischemia/infarct   In 9/23 developed overt HF. Initially treated for URI. Referred to Dr. Tamala Julian. Echo 12/23 EF 25-30%. Was diuresed and GDMT started. Feels good. Working [employer]. Denies CP or SOB. No edema, orthopnea or PND.  Says DM2 and HTN has been well controlled.    Sister with CABG at 78 y/o 2 other sisters ok  GM with "hole in her heart" F died with MI at 60 M ok   Review of Systems: [y] = yes, '[ ]'$  = no   General: Weight gain '[ ]'$ ; Weight loss '[ ]'$ ; Anorexia '[ ]'$ ; Fatigue '[ ]'$ ; Fever '[ ]'$ ; Chills '[ ]'$ ; Weakness '[ ]'$   Cardiac: Chest pain/pressure '[ ]'$ ; Resting SOB '[ ]'$ ; Exertional SOB '[ ]'$ ; Orthopnea '[ ]'$ ; Pedal Edema '[ ]'$ ; Palpitations '[ ]'$ ; Syncope '[ ]'$ ; Presyncope '[ ]'$ ; Paroxysmal nocturnal dyspnea'[ ]'$   Pulmonary: Cough '[ ]'$ ; Wheezing'[ ]'$ ; Hemoptysis'[ ]'$ ; Sputum '[ ]'$ ; Snoring '[ ]'$   GI: Vomiting'[ ]'$ ; Dysphagia'[ ]'$ ; Melena'[ ]'$ ; Hematochezia '[ ]'$ ; Heartburn'[ ]'$ ; Abdominal pain '[ ]'$ ; Constipation '[ ]'$ ; Diarrhea '[ ]'$ ; BRBPR '[ ]'$   GU: Hematuria'[ ]'$ ; Dysuria '[ ]'$ ; Nocturia'[ ]'$   Vascular: Pain in legs with walking '[ ]'$ ; Pain in feet with lying flat '[ ]'$ ; Non-healing sores '[ ]'$ ; Stroke '[ ]'$ ; TIA '[ ]'$ ; Slurred speech '[ ]'$ ;  Neuro: Headaches'[ ]'$ ; Vertigo'[ ]'$ ; Seizures'[ ]'$ ; Paresthesias'[ ]'$ ;Blurred vision '[ ]'$ ; Diplopia '[ ]'$ ; Vision changes '[ ]'$   Ortho/Skin: Arthritis '[ ]'$ ; Joint pain '[ ]'$ ; Muscle pain '[ ]'$ ; Joint swelling '[ ]'$ ; Back Pain '[ ]'$ ; Rash '[ ]'$   Psych: Depression'[ ]'$ ; Anxiety'[ ]'$   Heme: Bleeding problems '[ ]'$ ; Clotting disorders  '[ ]'$ ; Anemia '[ ]'$   Endocrine: Diabetes '[ ]'$ ; Thyroid dysfunction'[ ]'$    Past Medical History:  Diagnosis Date   Breast cancer ([place]) 2002   left breast   Cancer ([place]) 1995   BREAST, LEFT   Chronic kidney disease    FOLLOWED BY MD   Complication of anesthesia    SLOW TO AWAKEN    Diabetes mellitus without complication ([place])    Gout    Hand paresthesia    History of adenomatous polyp of colon    Hyperlipidemia    Hypertension    Low blood potassium SINCE 1995 OR 1996   Type II diabetes mellitus with nephropathy (H[facility])     Current Outpatient Medications  Medication Sig Dispense Refill   allopurinol (ZYLOPRIM) 100 MG tablet Take 100 mg by mouth daily.     Ascorbic Acid (VITA-C PO) Take 1 tablet by mouth daily at 6 (six) AM.     atorvastatin (LIPITOR) 40 MG tablet Take 40 mg by mouth daily.     B Complex Vitamins (B COMPLEX PO) Take by mouth daily.     CALCIUM PO Take 1 tablet by mouth daily.     carvedilol (COREG) 3.125 MG tablet Take 1 tablet (3.125  mg total) by mouth 2 (two) times daily. 180 tablet 3   furosemide (LASIX) 40 MG tablet Take 1 tablet (40 mg total) by mouth daily. 90 tablet 3   JARDIANCE 10 MG TABS tablet Take 10 mg by mouth daily.     KLOR-CON M20 20 MEQ tablet Take 20 mEq by mouth daily.     metFORMIN (GLUCOPHAGE-XR) 500 MG 24 hr tablet Take 500 mg by mouth daily.     Multiple Vitamin (MULTIVITAMIN) tablet Take 1 tablet by mouth daily.     sacubitril-valsartan (ENTRESTO) 24-26 MG Take 1 tablet by mouth 2 (two) times daily. 60 tablet 11   No current facility-administered medications for this encounter.    Allergies  Allergen Reactions   Clindamycin/Lincomycin Hives      Social History   Socioeconomic History   Marital status: Married    Spouse name: Not on file   Number of children: Not on file   Years of education: Not on file   Highest education level: Not on file  Occupational History   Not on file  Tobacco Use   Smoking status: Never   Smokeless  tobacco: Never  Vaping Use   Vaping Use: Never used  Substance and Sexual Activity   Alcohol use: No   Drug use: No   Sexual activity: Yes    Partners: Male    Birth control/protection: Surgical    Comment: 1st intercourse- 18, partners- more than 5  Other Topics Concern   Not on file  Social History Narrative   Not on file   Social Determinants of Health   Financial Resource Strain: Not on file  Food Insecurity: Not on file  Transportation Needs: Not on file  Physical Activity: Not on file  Stress: Not on file  Social Connections: Not on file  Intimate Partner Violence: Not on file      Family History  Problem Relation Age of Onset   Cancer Other    Hypertension Other    Cancer Mother        lymphoma   Diabetes Mother    Diabetes Maternal Grandmother    Hyperlipidemia Maternal Grandmother     Vitals:   11/09/22 1434  BP: 110/70  Pulse: 92  SpO2: 99%  Weight: 68.1 kg (150 lb 3.2 oz)    PHYSICAL EXAM: General:  Well appearing. No respiratory difficulty HEENT: normal Neck: supple. JVP 7 Carotids 2+ bilat; no bruits. No lymphadenopathy or thryomegaly appreciated. Cor: PMI nondisplaced. Regular rate & rhythm. Likely soft s3 Lungs: clear Abdomen: soft, nontender, nondistended. No hepatosplenomegaly. No bruits or masses. Good bowel sounds. Extremities: no cyanosis, clubbing, rash, trace edema Neuro: alert & oriented x 3, cranial nerves grossly intact. moves all 4 extremities w/o difficulty. Affect pleasant.  ECG: NSR 95 No ST-T wave abnormalities QRS 68m Personally reviewed    ASSESSMENT & PLAN:  1. Chronic systolic HF - Echo 138/75EF 25-30%  - Myoview 11/22 EF 41%  No ischemia/infarct  - Unclear etiology. Suspect HTN vs possible chemotherapy-induced. With DM2 and FHx she is at high risk for underlying CAD but only mild coronary Ca on recent non-contrast chest CT  - Reports NYHA I-II symptoms but cardiac exam suggests it may be worse - Volume status  elevated ReDS 29% - Continue Jardiance 10  - Continue Entresto 24/26 bid - Continue carvedilol 3.125 bid - Start spiro 12.5 cautiously. Watch labs - Will get cMRI - Defer cath for now given CKD IV bt may need at  some point - Will need close f/u  2.  HTN - well controlled   3. CKD IIIb-IV  - Scr 1.7-2.1 - Continue Jardiance - labs today    Glori Bickers, MD  3:02 PM

## 2022-11-10 ENCOUNTER — Telehealth: Payer: Self-pay | Admitting: *Deleted

## 2022-11-10 NOTE — Telephone Encounter (Signed)
S/w pt canceled upcoming Lindsay Bame, NP appt and lab appt. Pt is being followed by the CHF clinic.

## 2022-11-15 ENCOUNTER — Other Ambulatory Visit: Payer: Managed Care, Other (non HMO)

## 2022-11-15 ENCOUNTER — Ambulatory Visit: Payer: Managed Care, Other (non HMO) | Admitting: Nurse Practitioner

## 2022-11-17 ENCOUNTER — Ambulatory Visit: Payer: Managed Care, Other (non HMO) | Attending: Interventional Cardiology

## 2022-11-17 DIAGNOSIS — I10 Essential (primary) hypertension: Secondary | ICD-10-CM

## 2022-11-17 DIAGNOSIS — I5042 Chronic combined systolic (congestive) and diastolic (congestive) heart failure: Secondary | ICD-10-CM

## 2022-11-18 LAB — BASIC METABOLIC PANEL
BUN/Creatinine Ratio: 24 (ref 12–28)
BUN: 45 mg/dL — ABNORMAL HIGH (ref 8–27)
CO2: 22 mmol/L (ref 20–29)
Calcium: 10 mg/dL (ref 8.7–10.3)
Chloride: 101 mmol/L (ref 96–106)
Creatinine, Ser: 1.91 mg/dL — ABNORMAL HIGH (ref 0.57–1.00)
Glucose: 93 mg/dL (ref 70–99)
Potassium: 4.2 mmol/L (ref 3.5–5.2)
Sodium: 142 mmol/L (ref 134–144)
eGFR: 29 mL/min/{1.73_m2} — ABNORMAL LOW (ref >59)

## 2022-11-22 ENCOUNTER — Other Ambulatory Visit: Payer: Self-pay

## 2022-11-22 MED ORDER — FUROSEMIDE 40 MG PO TABS: 40.0000 mg | ORAL_TABLET | Freq: Every day | ORAL | 3 refills | Status: DC

## 2022-11-24 ENCOUNTER — Other Ambulatory Visit: Payer: Self-pay | Admitting: *Deleted

## 2023-02-09 ENCOUNTER — Telehealth (HOSPITAL_COMMUNITY): Payer: Self-pay | Admitting: *Deleted

## 2023-02-09 NOTE — Telephone Encounter (Signed)
Reaching out to patient to offer assistance regarding upcoming cardiac imaging study; pt verbalizes understanding of appt date/time, parking situation and where to check in, and verified current allergies; name and call back number provided for further questions should they arise  Lindsay Brick RN Navigator Cardiac Imaging [facility] [phone] office [phone] cell  Patient report some claustrophobia but denies metal.

## 2023-02-12 ENCOUNTER — Ambulatory Visit (HOSPITAL_COMMUNITY)
Admission: RE | Admit: 2023-02-12 | Discharge: 2023-02-12 | Disposition: A | Discharge status: Home / Self Care | Payer: Managed Care, Other (non HMO) | Source: Ambulatory Visit | Attending: Internal Medicine | Admitting: Internal Medicine

## 2023-02-12 ENCOUNTER — Other Ambulatory Visit (HOSPITAL_COMMUNITY): Payer: Self-pay | Admitting: Internal Medicine

## 2023-02-12 DIAGNOSIS — I5042 Chronic combined systolic (congestive) and diastolic (congestive) heart failure: Secondary | ICD-10-CM

## 2023-02-12 MED ORDER — GADOBUTROL 1 MMOL/ML IV SOLN
10.0000 mL | Freq: Once | INTRAVENOUS | Status: AC | PRN
  Administered 2023-02-12: 10 mL via INTRAVENOUS

## 2023-02-16 ENCOUNTER — Encounter (HOSPITAL_COMMUNITY): Payer: Self-pay | Admitting: Internal Medicine

## 2023-02-16 ENCOUNTER — Ambulatory Visit (HOSPITAL_COMMUNITY)
Admission: RE | Admit: 2023-02-16 | Discharge: 2023-02-16 | Disposition: A | Discharge status: Home / Self Care | Payer: Managed Care, Other (non HMO) | Source: Ambulatory Visit | Attending: Internal Medicine | Admitting: Internal Medicine

## 2023-02-16 VITALS — BP 120/70 | HR 71 | Wt 168.0 lb

## 2023-02-16 DIAGNOSIS — I129 Hypertensive chronic kidney disease with stage 1 through stage 4 chronic kidney disease, or unspecified chronic kidney disease: Secondary | ICD-10-CM | POA: Diagnosis not present

## 2023-02-16 DIAGNOSIS — Z79899 Other long term (current) drug therapy: Secondary | ICD-10-CM | POA: Diagnosis not present

## 2023-02-16 DIAGNOSIS — E1122 Type 2 diabetes mellitus with diabetic chronic kidney disease: Secondary | ICD-10-CM | POA: Insufficient documentation

## 2023-02-16 DIAGNOSIS — N184 Chronic kidney disease, stage 4 (severe): Secondary | ICD-10-CM | POA: Insufficient documentation

## 2023-02-16 DIAGNOSIS — E118 Type 2 diabetes mellitus with unspecified complications: Secondary | ICD-10-CM | POA: Diagnosis not present

## 2023-02-16 DIAGNOSIS — N1832 Chronic kidney disease, stage 3b: Secondary | ICD-10-CM

## 2023-02-16 DIAGNOSIS — R079 Chest pain, unspecified: Secondary | ICD-10-CM | POA: Diagnosis not present

## 2023-02-16 DIAGNOSIS — I130 Hypertensive heart and chronic kidney disease with heart failure and stage 1 through stage 4 chronic kidney disease, or unspecified chronic kidney disease: Secondary | ICD-10-CM | POA: Insufficient documentation

## 2023-02-16 DIAGNOSIS — Z853 Personal history of malignant neoplasm of breast: Secondary | ICD-10-CM | POA: Insufficient documentation

## 2023-02-16 DIAGNOSIS — I5022 Chronic systolic (congestive) heart failure: Secondary | ICD-10-CM

## 2023-02-16 LAB — BRAIN NATRIURETIC PEPTIDE: B Natriuretic Peptide: 10 pg/mL (ref 0.0–100.0)

## 2023-02-16 LAB — BASIC METABOLIC PANEL
Anion gap: 10 (ref 5–15)
BUN: 52 mg/dL — ABNORMAL HIGH (ref 8–23)
CO2: 23 mmol/L (ref 22–32)
Calcium: 9.8 mg/dL (ref 8.9–10.3)
Chloride: 104 mmol/L (ref 98–111)
Creatinine, Ser: 2.44 mg/dL — ABNORMAL HIGH (ref 0.44–1.00)
GFR, Estimated: 21 mL/min — ABNORMAL LOW (ref >60)
Glucose, Bld: 119 mg/dL — ABNORMAL HIGH (ref 70–99)
Potassium: 4.5 mmol/L (ref 3.5–5.1)
Sodium: 137 mmol/L (ref 135–145)

## 2023-02-16 MED ORDER — CARVEDILOL 6.25 MG PO TABS: 6.2500 mg | ORAL_TABLET | Freq: Two times a day (BID) | ORAL | 3 refills | Status: DC

## 2023-02-16 NOTE — Progress Notes (Signed)
ADVANCED HF CLINIC NOTE  Referring Physician: Thana Ates, MD Primary Care: Thana Ates, MD Primary Cardiologist: Donato Schultz, MD  Nephrology: Dr. 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 Anne Fu for further evaluation of her HF    Was treated for breast cancer in 1995. Had surgery and chemo (does not remember which chemo).  Had Myoview in 11/22 for CP. EF 41%  No ischemia/infarct   In 9/23 developed overt HF. Initially treated for URI. Referred to Dr. Katrinka Blazing. Echo 12/23 EF 25-30%. Was diuresed and GDMT started.   I saw for the first time in 2/24. Was doing well. cMRI ordered.   cMRI 5/24: cMRI 37% RV 42% Minimal LGE. No infiltrative process  Feels good. Still working for the city. No SOB, edema, orthopnea or PND. No problems with meds.  Sister with CABG at 48 y/o 2 other sisters ok  GM with "hole in her heart" F died with MI at 41 M ok     Past Medical History:  Diagnosis Date   Breast cancer ([facility]) 2002   left breast   Cancer ([facility]) 1995   BREAST, LEFT   Chronic kidney disease    FOLLOWED BY MD   Complication of anesthesia    SLOW TO AWAKEN    Diabetes mellitus without complication ([facility])    Gout    Hand paresthesia    History of adenomatous polyp of colon    Hyperlipidemia    Hypertension    Low blood potassium SINCE 1995 OR 1996   Type II diabetes mellitus with nephropathy ([facility])     Current Outpatient Medications  Medication Sig Dispense Refill   allopurinol (ZYLOPRIM) 100 MG tablet Take 100 mg by mouth daily.     Ascorbic Acid (VITA-C PO) Take 1 tablet by mouth daily at 6 (six) AM.     atorvastatin (LIPITOR) 40 MG tablet Take 40 mg by mouth daily.     B Complex Vitamins (B COMPLEX PO) Take by mouth daily.     CALCIUM PO Take 1 tablet by mouth daily.     carvedilol (COREG) 3.125 MG tablet Take 1 tablet (3.125 mg total) by  mouth 2 (two) times daily. 180 tablet 3   furosemide (LASIX) 40 MG tablet Take 1 tablet (40 mg total) by mouth daily. 90 tablet 3   JARDIANCE 10 MG TABS tablet Take 10 mg by mouth daily.     KLOR-CON M20 20 MEQ tablet Take 20 mEq by mouth daily.     metFORMIN (GLUCOPHAGE-XR) 500 MG 24 hr tablet Take 500 mg by mouth daily.     Multiple Vitamin (MULTIVITAMIN) tablet Take 1 tablet by mouth daily.     sacubitril-valsartan (ENTRESTO) 24-26 MG Take 1 tablet by mouth 2 (two) times daily. 60 tablet 11   spironolactone (ALDACTONE) 25 MG tablet Take 0.5 tablets (12.5 mg total) by mouth daily. 45 tablet 3   No current facility-administered medications for this encounter.    Allergies  Allergen Reactions   Clindamycin/Lincomycin Hives      Social History   Socioeconomic History   Marital status: Married    Spouse name: Not on file   Number of children: Not on file   Years of education: Not on file   Highest education level: Not on file  Occupational History  Not on file  Tobacco Use   Smoking status: Never   Smokeless tobacco: Never  Vaping Use   Vaping Use: Never used  Substance and Sexual Activity   Alcohol use: No   Drug use: No   Sexual activity: Yes    Partners: Male    Birth control/protection: Surgical    Comment: 1st intercourse- 18, partners- more than 5  Other Topics Concern   Not on file  Social History Narrative   Not on file   Social Determinants of Health   Financial Resource Strain: Not on file  Food Insecurity: Not on file  Transportation Needs: Not on file  Physical Activity: Not on file  Stress: Not on file  Social Connections: Not on file  Intimate Partner Violence: Not on file      Family History  Problem Relation Age of Onset   Cancer Other    Hypertension Other    Cancer Mother        lymphoma   Diabetes Mother    Diabetes Maternal Grandmother    Hyperlipidemia Maternal Grandmother     Vitals:   02/16/23 0924  BP: 120/70  Pulse: 71   SpO2: 100%  Weight: 76.2 kg (168 lb)    PHYSICAL EXAM: General:  Well appearing. No resp difficulty HEENT: normal Neck: supple. no JVD. Carotids 2+ bilat; no bruits. No lymphadenopathy or thryomegaly appreciated. Cor: PMI nondisplaced. Regular rate & rhythm. No rubs, gallops or murmurs. Lungs: clear Abdomen: soft, nontender, nondistended. No hepatosplenomegaly. No bruits or masses. Good bowel sounds. Extremities: no cyanosis, clubbing, rash, edema Neuro: alert & orientedx3, cranial nerves grossly intact. moves all 4 extremities w/o difficulty. Affect pleasant     ASSESSMENT & PLAN:  1. Chronic systolic HF - Echo 12/23 EF 25-30%  - Myoview 11/22 EF 41%  No ischemia/infarct  - Unclear etiology. Suspect HTN vs possible chemotherapy-induced. With DM2 and FHx she is at high risk for underlying CAD but only mild coronary Ca on recent non-contrast chest CT  - cMRI 5/24: cMRI 37% RV 42% Minimal LGE. No infiltrative process. 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  - Defer cath for now given CKD IV bt may need at some point - Will need close f/u - Repeat echo in 6 months  2.  HTN - Blood pressure well controlled. Continue current regimen.  3. CKD IIIb-IV  - Scr 1.7-2.1 - Continue Jardiance - labs today   Arvilla Meres, MD  9:46 AM

## 2023-02-16 NOTE — Patient Instructions (Signed)
INCREASE Carvedilol to  6.25mg  Twice daily  Labs done today, your results will be available in MyChart, we will contact you for abnormal readings.  Your physician recommends that you schedule a follow-up appointment in: 3 months  If you have any questions or concerns before your next appointment please send [place] a message through [place] or call our office at [phone].    TO LEAVE A MESSAGE FOR THE NURSE SELECT OPTION 2, PLEASE LEAVE A MESSAGE INCLUDING: YOUR NAME DATE OF BIRTH CALL BACK NUMBER REASON FOR CALL**this is important as we prioritize the call backs  YOU WILL RECEIVE A CALL BACK THE SAME DAY AS LONG AS YOU CALL BEFORE 4:00 PM  At the [facility], you and your health needs are our priority. As part of our continuing mission to provide you with exceptional heart care, we have created designated Provider Care Teams. These Care Teams include your primary Cardiologist (physician) and Advanced Practice Providers (APPs- Physician Assistants and Nurse Practitioners) who all work together to provide you with the care you need, when you need it.   You may see any of the following providers on your designated Care Team at your next follow up: Dr Arvilla Meres Dr Marca Ancona Dr. Marcos Eke, NP Robbie Lis, [place] [facility] [place], [place] Brynda Peon, NP Karle Plumber, PharmD   Please be sure to bring in all your medications bottles to every appointment.    Thank you for choosing [facility]

## 2023-02-16 NOTE — Addendum Note (Signed)
Encounter addended by: Linda Hedges, RN on: 02/16/2023 10:02 AM  Actions taken: Order list changed, Diagnosis association updated, Clinical Note Signed, Charge Capture section accepted

## 2023-05-18 ENCOUNTER — Ambulatory Visit: Payer: Managed Care, Other (non HMO) | Admitting: Obstetrics & Gynecology

## 2023-05-18 NOTE — Progress Notes (Signed)
ADVANCED HF CLINIC NOTE  Referring Physician: Thana Ates, MD Primary Care: Thana Ates, MD Primary Cardiologist: Donato Schultz, MD  Nephrology: Dr. 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 Anne Fu for further evaluation of her HF    Was treated for breast cancer in 1995. Had surgery and chemo (does not remember which chemo).  Had Myoview in 11/22 for CP. EF 41%  No ischemia/infarct   In 9/23 developed overt HF. Initially treated for URI. Referred to Dr. Katrinka Blazing. Echo 12/23 EF 25-30%. Was diuresed and GDMT started.   I saw for the first time in 2/24. Was doing well. cMRI ordered.   cMRI 5/24: cMRI 37% RV 42% Minimal LGE. No infiltrative process  Feels good. Still working for the city. No SOB, edema, orthopnea or PND. No problems with meds.  Sister with CABG at 31 y/o 2 other sisters ok  GM with "hole in her heart" F died with MI at 63 M ok     Past Medical History:  Diagnosis Date   Breast cancer (HCC) 2002   left breast   Cancer (HCC) 1995   BREAST, LEFT   Chronic kidney disease    FOLLOWED BY MD   Complication of anesthesia    SLOW TO AWAKEN    Diabetes mellitus without complication (HCC)    Gout    Hand paresthesia    History of adenomatous polyp of colon    Hyperlipidemia    Hypertension    Low blood potassium SINCE 1995 OR 1996   Type II diabetes mellitus with nephropathy (HCC)     Current Outpatient Medications  Medication Sig Dispense Refill   allopurinol (ZYLOPRIM) 100 MG tablet Take 100 mg by mouth daily.     Ascorbic Acid (VITA-C PO) Take 1 tablet by mouth daily at 6 (six) AM.     atorvastatin (LIPITOR) 40 MG tablet Take 40 mg by mouth daily.     B Complex Vitamins (B COMPLEX PO) Take by mouth daily.     CALCIUM PO Take 1 tablet by mouth daily.     carvedilol (COREG) 6.25 MG tablet Take 1 tablet (6.25 mg total) by  mouth 2 (two) times daily. 180 tablet 3   furosemide (LASIX) 40 MG tablet Take 1 tablet (40 mg total) by mouth daily. 90 tablet 3   JARDIANCE 10 MG TABS tablet Take 10 mg by mouth daily.     KLOR-CON M20 20 MEQ tablet Take 20 mEq by mouth daily.     metFORMIN (GLUCOPHAGE-XR) 500 MG 24 hr tablet Take 500 mg by mouth daily.     Multiple Vitamin (MULTIVITAMIN) tablet Take 1 tablet by mouth daily.     sacubitril-valsartan (ENTRESTO) 24-26 MG Take 1 tablet by mouth 2 (two) times daily. 60 tablet 11   spironolactone (ALDACTONE) 25 MG tablet Take 0.5 tablets (12.5 mg total) by mouth daily. 45 tablet 3   No current facility-administered medications for this visit.    Allergies  Allergen Reactions   Clindamycin/Lincomycin Hives      Social History   Socioeconomic History   Marital status: Married    Spouse name: Not on file   Number of children: Not on file   Years of education: Not on file   Highest education level: Not on file  Occupational History  Not on file  Tobacco Use   Smoking status: Never   Smokeless tobacco: Never  Vaping Use   Vaping status: Never Used  Substance and Sexual Activity   Alcohol use: No   Drug use: No   Sexual activity: Yes    Partners: Male    Birth control/protection: Surgical    Comment: 1st intercourse- 18, partners- more than 5  Other Topics Concern   Not on file  Social History Narrative   Not on file   Social Determinants of Health   Financial Resource Strain: Not on file  Food Insecurity: Not on file  Transportation Needs: Not on file  Physical Activity: Not on file  Stress: Not on file  Social Connections: Unknown (08/11/2022)   Received from [facility]   Social Network    Social Network: Not on file  Intimate Partner Violence: Unknown (08/11/2022)   Received from [facility], [facility]   HITS    Physically Hurt: Not on file    Insult or Talk Down To: Not on file    Threaten Physical Harm: Not on file     Scream or Curse: Not on file      Family History  Problem Relation Age of Onset   Cancer Other    Hypertension Other    Cancer Mother        lymphoma   Diabetes Mother    Diabetes Maternal Grandmother    Hyperlipidemia Maternal Grandmother     There were no vitals filed for this visit.   PHYSICAL EXAM: General:  Well appearing. No resp difficulty HEENT: normal Neck: supple. no JVD. Carotids 2+ bilat; no bruits. No lymphadenopathy or thryomegaly appreciated. Cor: PMI nondisplaced. Regular rate & rhythm. No rubs, gallops or murmurs. Lungs: clear Abdomen: soft, nontender, nondistended. No hepatosplenomegaly. No bruits or masses. Good bowel sounds. Extremities: no cyanosis, clubbing, rash, edema Neuro: alert & orientedx3, cranial nerves grossly intact. moves all 4 extremities w/o difficulty. Affect pleasant     ASSESSMENT & PLAN:  1. Chronic systolic HF - Echo 12/23 EF 25-30%  - Myoview 11/22 EF 41%  No ischemia/infarct  - Unclear etiology. Suspect HTN vs possible chemotherapy-induced. With DM2 and FHx she is at high risk for underlying CAD but only mild coronary Ca on recent non-contrast chest CT  - cMRI 5/24: cMRI 37% RV 42% Minimal LGE. No infiltrative process. 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  - Defer cath for now given CKD IV bt may need at some point - Will need close f/u - Repeat echo in 6 months  2.  HTN - Blood pressure well controlled. Continue current regimen.  3. CKD IIIb-IV  - Scr 1.7-2.1 - Continue Jardiance - labs today   Jacklynn Ganong, FNP  2:44 PM

## 2023-05-21 ENCOUNTER — Encounter (HOSPITAL_COMMUNITY): Payer: Self-pay

## 2023-05-21 ENCOUNTER — Ambulatory Visit (HOSPITAL_COMMUNITY)
Admission: RE | Admit: 2023-05-21 | Discharge: 2023-05-21 | Disposition: A | Discharge status: Home / Self Care | Payer: Managed Care, Other (non HMO) | Source: Ambulatory Visit | Attending: Family Medicine | Admitting: Family Medicine

## 2023-05-21 VITALS — BP 110/70 | HR 71 | Wt 175.4 lb

## 2023-05-21 DIAGNOSIS — Z853 Personal history of malignant neoplasm of breast: Secondary | ICD-10-CM | POA: Diagnosis not present

## 2023-05-21 DIAGNOSIS — Z79899 Other long term (current) drug therapy: Secondary | ICD-10-CM | POA: Insufficient documentation

## 2023-05-21 DIAGNOSIS — E1122 Type 2 diabetes mellitus with diabetic chronic kidney disease: Secondary | ICD-10-CM | POA: Diagnosis not present

## 2023-05-21 DIAGNOSIS — Z8249 Family history of ischemic heart disease and other diseases of the circulatory system: Secondary | ICD-10-CM | POA: Insufficient documentation

## 2023-05-21 DIAGNOSIS — N1832 Chronic kidney disease, stage 3b: Secondary | ICD-10-CM

## 2023-05-21 DIAGNOSIS — N184 Chronic kidney disease, stage 4 (severe): Secondary | ICD-10-CM | POA: Insufficient documentation

## 2023-05-21 DIAGNOSIS — I5022 Chronic systolic (congestive) heart failure: Secondary | ICD-10-CM | POA: Diagnosis present

## 2023-05-21 DIAGNOSIS — Z7984 Long term (current) use of oral hypoglycemic drugs: Secondary | ICD-10-CM | POA: Diagnosis not present

## 2023-05-21 DIAGNOSIS — I10 Essential (primary) hypertension: Secondary | ICD-10-CM

## 2023-05-21 DIAGNOSIS — I130 Hypertensive heart and chronic kidney disease with heart failure and stage 1 through stage 4 chronic kidney disease, or unspecified chronic kidney disease: Secondary | ICD-10-CM | POA: Insufficient documentation

## 2023-05-21 DIAGNOSIS — Z9221 Personal history of antineoplastic chemotherapy: Secondary | ICD-10-CM | POA: Diagnosis not present

## 2023-05-21 LAB — BRAIN NATRIURETIC PEPTIDE: B Natriuretic Peptide: 10.7 pg/mL (ref 0.0–100.0)

## 2023-05-21 LAB — BASIC METABOLIC PANEL
Anion gap: 14 (ref 5–15)
BUN: 62 mg/dL — ABNORMAL HIGH (ref 8–23)
CO2: 26 mmol/L (ref 22–32)
Calcium: 9.7 mg/dL (ref 8.9–10.3)
Chloride: 98 mmol/L (ref 98–111)
Creatinine, Ser: 2.54 mg/dL — ABNORMAL HIGH (ref 0.44–1.00)
GFR, Estimated: 20 mL/min — ABNORMAL LOW (ref >60)
Glucose, Bld: 103 mg/dL — ABNORMAL HIGH (ref 70–99)
Potassium: 4.6 mmol/L (ref 3.5–5.1)
Sodium: 138 mmol/L (ref 135–145)

## 2023-05-21 NOTE — Patient Instructions (Signed)
Medication Changes:  No Changes In Medications at this time.   Lab Work:  Labs done today, your results will be available in MyChart, we will contact you for abnormal readings.  Testing/Procedures:  Your physician has requested that you have an echocardiogram IN 3 MONTHS ON THE DAY OF NEXT APPOINTMENT. Echocardiography is a painless test that uses sound waves to create images of your heart. It provides your doctor with information about the size and shape of your heart and how well your heart's chambers and valves are working. This procedure takes approximately one hour. There are no restrictions for this procedure. Please do NOT wear cologne, perfume, aftershave, or lotions (deodorant is allowed). Please arrive 15 minutes prior to your appointment time.  Follow-Up in: 3 MONTHS WITH DR. Gala Romney- CALL OUR OFFICE AROUND THE FIRST OF SEPTEMBER TO GET THIS SCHEDULED   At the [facility], you and your health needs are our priority. We have a designated team specialized in the treatment of Heart Failure. This Care Team includes your primary Heart Failure Specialized Cardiologist (physician), Advanced Practice Providers (APPs- Physician Assistants and Nurse Practitioners), and Pharmacist who all work together to provide you with the care you need, when you need it.   You may see any of the following providers on your designated Care Team at your next follow up:  Dr. Arvilla Meres Dr. Marca Ancona Dr. Marcos Eke, NP Robbie Lis, [place] [facility] [place], [place] Brynda Peon, NP Karle Plumber, PharmD   Please be sure to bring in all your medications bottles to every appointment.   Need to Contact us:  If you have any questions or concerns before your next appointment please send [place] a message through [place] or call our office at [phone].    TO LEAVE A MESSAGE FOR THE NURSE SELECT OPTION 2, PLEASE LEAVE A MESSAGE INCLUDING: YOUR  NAME DATE OF BIRTH CALL BACK NUMBER REASON FOR CALL**this is important as we prioritize the call backs  YOU WILL RECEIVE A CALL BACK THE SAME DAY AS LONG AS YOU CALL BEFORE 4:00 PM

## 2023-05-21 NOTE — Progress Notes (Signed)
ReDS Vest / Clip - 05/21/23 1000       ReDS Vest / Clip   Station Marker A    Ruler Value 33    ReDS Value Range Low volume    ReDS Actual Value 31

## 2023-05-23 ENCOUNTER — Telehealth (HOSPITAL_COMMUNITY): Payer: Self-pay

## 2023-05-23 MED ORDER — FUROSEMIDE 40 MG PO TABS: ORAL_TABLET | ORAL | 3 refills | Status: DC

## 2023-05-23 NOTE — Telephone Encounter (Signed)
-----   Message from Jacklynn Ganong sent at 05/21/2023  9:11 PM EDT ----- Kidney function mildly reduced from previous baseline.  Please decrease Lasix to 40 mg daily alternating with 20 mg every other day.   Repeat BMET in 2 weeks

## 2023-05-23 NOTE — Telephone Encounter (Signed)
Patient advised and verbalized understanding. Med list updated to reflect changes. Patient has an appointment with her pcp coming up will have repeat bloodwork at that time.   Meds ordered this encounter  Medications   furosemide (LASIX) 40 MG tablet    Sig: Take 40mg  (1 tablet) by mouth daily alternating with 20mg  (1/2 tablet) every other day.    Dispense:  90 tablet    Refill:  3    Dose change (decreased frequency to once daily)

## 2023-05-30 LAB — LAB REPORT - SCANNED
A1c: 7.5
EGFR: 21

## 2023-06-01 ENCOUNTER — Other Ambulatory Visit: Payer: Self-pay | Admitting: Internal Medicine

## 2023-06-01 DIAGNOSIS — Z780 Asymptomatic menopausal state: Secondary | ICD-10-CM

## 2023-07-05 ENCOUNTER — Ambulatory Visit: Payer: Managed Care, Other (non HMO) | Admitting: Obstetrics and Gynecology

## 2023-09-24 ENCOUNTER — Other Ambulatory Visit: Payer: Self-pay

## 2023-09-24 MED ORDER — SACUBITRIL-VALSARTAN 24-26 MG PO TABS: 1.0000 | ORAL_TABLET | Freq: Two times a day (BID) | ORAL | 4 refills | Status: DC

## 2023-09-24 NOTE — Telephone Encounter (Signed)
This is a CHF pt 

## 2023-10-22 ENCOUNTER — Other Ambulatory Visit (HOSPITAL_COMMUNITY): Payer: Self-pay | Admitting: Internal Medicine

## 2024-01-28 ENCOUNTER — Other Ambulatory Visit (HOSPITAL_COMMUNITY): Payer: Self-pay | Admitting: Family Medicine

## 2024-01-29 ENCOUNTER — Other Ambulatory Visit (HOSPITAL_COMMUNITY): Payer: Self-pay

## 2024-01-29 MED ORDER — SPIRONOLACTONE 25 MG PO TABS: 12.5000 mg | ORAL_TABLET | Freq: Every day | ORAL | 5 refills | Status: AC

## 2024-02-13 ENCOUNTER — Ambulatory Visit (HOSPITAL_COMMUNITY)
Admission: RE | Admit: 2024-02-13 | Discharge: 2024-02-13 | Disposition: A | Discharge status: Home / Self Care | Source: Ambulatory Visit | Attending: Internal Medicine | Admitting: Internal Medicine

## 2024-02-13 ENCOUNTER — Encounter (HOSPITAL_COMMUNITY): Payer: Self-pay | Admitting: Internal Medicine

## 2024-02-13 VITALS — BP 120/80 | HR 67 | Wt 182.0 lb

## 2024-02-13 DIAGNOSIS — Z79899 Other long term (current) drug therapy: Secondary | ICD-10-CM | POA: Diagnosis not present

## 2024-02-13 DIAGNOSIS — Z7984 Long term (current) use of oral hypoglycemic drugs: Secondary | ICD-10-CM | POA: Diagnosis not present

## 2024-02-13 DIAGNOSIS — Z006 Encounter for examination for normal comparison and control in clinical research program: Secondary | ICD-10-CM

## 2024-02-13 DIAGNOSIS — I5022 Chronic systolic (congestive) heart failure: Secondary | ICD-10-CM

## 2024-02-13 DIAGNOSIS — I10 Essential (primary) hypertension: Secondary | ICD-10-CM | POA: Diagnosis not present

## 2024-02-13 DIAGNOSIS — N184 Chronic kidney disease, stage 4 (severe): Secondary | ICD-10-CM | POA: Diagnosis not present

## 2024-02-13 DIAGNOSIS — I130 Hypertensive heart and chronic kidney disease with heart failure and stage 1 through stage 4 chronic kidney disease, or unspecified chronic kidney disease: Secondary | ICD-10-CM | POA: Diagnosis not present

## 2024-02-13 DIAGNOSIS — E1122 Type 2 diabetes mellitus with diabetic chronic kidney disease: Secondary | ICD-10-CM | POA: Insufficient documentation

## 2024-02-13 LAB — ECHOCARDIOGRAM COMPLETE
AR max vel: 1.86 cm2
AV Area VTI: 2.03 cm2
AV Area mean vel: 2.07 cm2
AV Mean grad: 3 mmHg
AV Peak grad: 5.7 mmHg
Ao pk vel: 1.19 m/s
Area-P 1/2: 3.16 cm2
Calc EF: 41 %
S' Lateral: 4.3 cm
Single Plane A2C EF: 40.3 %
Single Plane A4C EF: 41.2 %

## 2024-02-13 LAB — COMPREHENSIVE METABOLIC PANEL WITH GFR
ALT: 22 U/L (ref 0–44)
AST: 27 U/L (ref 15–41)
Albumin: 3.6 g/dL (ref 3.5–5.0)
Alkaline Phosphatase: 98 U/L (ref 38–126)
Anion gap: 10 (ref 5–15)
BUN: 41 mg/dL — ABNORMAL HIGH (ref 8–23)
CO2: 27 mmol/L (ref 22–32)
Calcium: 10 mg/dL (ref 8.9–10.3)
Chloride: 104 mmol/L (ref 98–111)
Creatinine, Ser: 2.6 mg/dL — ABNORMAL HIGH (ref 0.44–1.00)
GFR, Estimated: 20 mL/min — ABNORMAL LOW (ref >60)
Glucose, Bld: 123 mg/dL — ABNORMAL HIGH (ref 70–99)
Potassium: 4.7 mmol/L (ref 3.5–5.1)
Sodium: 141 mmol/L (ref 135–145)
Total Bilirubin: 0.7 mg/dL (ref 0.0–1.2)
Total Protein: 7.2 g/dL (ref 6.5–8.1)

## 2024-02-13 LAB — BRAIN NATRIURETIC PEPTIDE: B Natriuretic Peptide: 10.1 pg/mL (ref 0.0–100.0)

## 2024-02-13 NOTE — Research (Signed)
 SITE: [id number]     Subject # [id number]   Subprotocol: A  Inclusion Criteria  Patients who meet all of the following criteria are eligible for enrollment as study participants:  Yes No  Age > 67 years old X   Eligible to wear Holter Study X    Exclusion Criteria  Patients who meet any of these criteria are not eligible for enrollment as study participants: Yes No  1. Receiving any mechanical (respiratory or circulatory) or renal support therapy at Screening or during Visit #1.  X  2.  Any other conditions that in the opinion of the investigators are likely to prevent compliance with the study protocol or pose a safety concern if the subject participates in the study.  X  3. Poor tolerance, namely susceptible to severe skin allergies from ECG adhesive patch application.  X   Protocol: REV H    60 minute start window         Cor device must be applied, and the study initiated, no later than 60 minutes of completing the Echocardiogram                             HH:MM  Echo completion time  08:35  2.   Cor Study start time  08:50   30-Minute execution window  Once Cor Monitoring begins, 3 QT Med ECGs and the 15-minute rest period must be completed within a 30 minute window     HH:MM  3. QT Med ECG Completion time  08:56  4. Start of 15-Min sitting rest period  08:58  5. End of 15-Min rest period  09:13  6. Time of device removal  09:22   *Continue to use the Mobile App Event feature to log the Rest period windows and follow instructions on the EF-ACT Clinical Trial  Patient Instruction Card.  Describe any anomalies in Protocol execution in the Protocol Deviation Log    Residential Zip code [address] (First 3 digits ONLY)                                           [facility] Informed Consent   Subject Name: Lindsay Quinn  Subject met inclusion and exclusion criteria.  The informed consent form, study requirements and expectations were reviewed with the subject. Subject had opportunity to  read consent and questions and concerns were addressed prior to the signing of the consent form.  The subject verbalized understanding of the trial requirements.  The subject agreed to participate in the [facility] EF ACT trial and signed the informed consent at 08:43 on 13-Feb-2024.  The informed consent was obtained prior to performance of any protocol-specific procedures for the subject.  A copy of the signed informed consent was given to the subject and a copy was placed in the subject's medical record.   Lindsay Quinn          Current Outpatient Medications:    allopurinol (ZYLOPRIM) 100 MG tablet, Take 100 mg by mouth daily., Disp: , Rfl:    Ascorbic Acid (VITA-C PO), Take 1 tablet by mouth daily at 6 (six) AM., Disp: , Rfl:    atorvastatin (LIPITOR) 40 MG tablet, Take 40 mg by mouth daily., Disp: , Rfl:    B Complex Vitamins (B COMPLEX PO), Take by mouth daily., Disp: , Rfl:  CALCIUM PO, Take 1 tablet by mouth daily., Disp: , Rfl:    carvedilol  (COREG ) 6.25 MG tablet, Take 1 tablet (6.25 mg total) by mouth 2 (two) times daily., Disp: 180 tablet, Rfl: 3   furosemide  (LASIX ) 40 MG tablet, Take 40mg  (1 tablet) by mouth daily alternating with 20mg  (1/2 tablet) every other day., Disp: 90 tablet, Rfl: 3   JARDIANCE 10 MG TABS tablet, Take 10 mg by mouth daily., Disp: , Rfl:    KLOR-CON M20 20 MEQ tablet, Take 20 mEq by mouth daily., Disp: , Rfl:    Multiple Vitamin (MULTIVITAMIN) tablet, Take 1 tablet by mouth daily., Disp: , Rfl:    sacubitril -valsartan  (ENTRESTO ) 24-26 MG, Take 1 tablet by mouth 2 (two) times daily. NEEDS FOLLOW UP APPOINTMENT FOR MORE REFILLS, Disp: 60 tablet, Rfl: 4   spironolactone  (ALDACTONE ) 25 MG tablet, Take 0.5 tablets (12.5 mg total) by mouth daily., Disp: 45 tablet, Rfl: 5

## 2024-02-13 NOTE — Progress Notes (Signed)
 ADVANCED HF CLINIC NOTE  Primary Care: Tena Feeling, MD Primary Cardiologist: Dorothye Gathers, MD Nephrology: Dr. 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 Renna Cary for further evaluation of her HF    Was treated for breast cancer in 1995. Had surgery and chemo (does not remember which chemo).  Had Myoview  in 11/22 for CP. EF 41%  No ischemia/infarct   In 9/23 developed overt HF Echo 12/23 EF 25-30%. Was diuresed and GDMT started.   cMRI 5/24: LVEF 37% RVEF 42%, Minimal LGE. No infiltrative process. 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  Here today for f/u with her husband. She works as a Clinical biochemist for [employer]. Feels great. Denies CP or SOB. Compliant with meds.   Echo today 02/13/24: EF 50-55%   Family Hx: Sister with CABG at 67 y/o, 2 other sisters ok  GM with "hole in her heart" F died with MI at 66 M ok    Cardiac Studies  - cMRI (5/24): LVEF 37%, RVEF 42%, minimal LGE, no infiltrative process.  - Echo (12/23): EF 25-30%  - Myoview  (11/22): EF 41%, no ischemia/infarct  Past Medical History:  Diagnosis Date   Breast cancer (HCC) 2002   left breast   Cancer (HCC) 1995   BREAST, LEFT   Chronic kidney disease    FOLLOWED BY MD   Complication of anesthesia    SLOW TO AWAKEN    Diabetes mellitus without complication (HCC)    Gout    Hand paresthesia    History of adenomatous polyp of colon    Hyperlipidemia    Hypertension    Low blood potassium SINCE 1995 OR 1996   Type II diabetes mellitus with nephropathy (HCC)     Current Outpatient Medications  Medication Sig Dispense Refill   allopurinol (ZYLOPRIM) 100 MG tablet Take 100 mg by mouth daily.     Ascorbic Acid (VITA-C PO) Take 1 tablet by mouth daily at 6 (six) AM.     atorvastatin (LIPITOR) 40 MG tablet Take 40 mg by mouth daily.     B Complex Vitamins (B COMPLEX PO)  Take by mouth daily.     CALCIUM PO Take 1 tablet by mouth daily.     carvedilol  (COREG ) 6.25 MG tablet Take 1 tablet (6.25 mg total) by mouth 2 (two) times daily. 180 tablet 3   furosemide  (LASIX ) 40 MG tablet Take 40mg  (1 tablet) by mouth daily alternating with 20mg  (1/2 tablet) every other day. 90 tablet 3   JARDIANCE 10 MG TABS tablet Take 10 mg by mouth daily.     KLOR-CON M20 20 MEQ tablet Take 20 mEq by mouth daily.     Multiple Vitamin (MULTIVITAMIN) tablet Take 1 tablet by mouth daily.     sacubitril -valsartan  (ENTRESTO ) 24-26 MG Take 1 tablet by mouth 2 (two) times daily. NEEDS FOLLOW UP APPOINTMENT FOR MORE REFILLS 60 tablet 4   spironolactone  (ALDACTONE ) 25 MG tablet Take 0.5 tablets (12.5 mg total) by mouth daily. 45 tablet 5   No current facility-administered medications for this encounter.   Allergies  Allergen Reactions   Clindamycin/Lincomycin Hives   Social History   Socioeconomic History   Marital status: Married    Spouse name: Not on file   Number of children: Not on file   Years of education: Not on  file   Highest education level: Not on file  Occupational History   Not on file  Tobacco Use   Smoking status: Never   Smokeless tobacco: Never  Vaping Use   Vaping status: Never Used  Substance and Sexual Activity   Alcohol use: No   Drug use: No   Sexual activity: Yes    Partners: Male    Birth control/protection: Surgical    Comment: 1st intercourse- 18, partners- more than 5  Other Topics Concern   Not on file  Social History Narrative   Not on file   Social Drivers of Health   Financial Resource Strain: Not on file  Food Insecurity: Not on file  Transportation Needs: Not on file  Physical Activity: Not on file  Stress: Not on file  Social Connections: Unknown (08/11/2022)   [facility]   Social Network    Social Network: Not on file  Intimate Partner Violence: Unknown (08/11/2022)   Received from [employer] [facility],  [facility]   HITS    Physically Hurt: Not on file    Insult or Talk Down To: Not on file    Threaten Physical Harm: Not on file    Scream or Curse: Not on file    Family History  Problem Relation Age of Onset   Cancer Other    Hypertension Other    Cancer Mother        lymphoma   Diabetes Mother    Diabetes Maternal Grandmother    Hyperlipidemia Maternal Grandmother     BP 120/80   Pulse 67   Wt 82.6 kg (182 lb)   SpO2 99%   BMI 30.05 kg/m   Wt Readings from Last 3 Encounters:  02/13/24 82.6 kg (182 lb)  05/21/23 79.6 kg (175 lb 6.4 oz)  02/16/23 76.2 kg (168 lb)   PHYSICAL EXAM: General:  Well appearing. No resp difficulty HEENT: normal Neck: supple. no JVD. Carotids 2+ bilat; no bruits. No lymphadenopathy or thryomegaly appreciated. Cor: PMI nondisplaced. Regular rate & rhythm. No rubs, gallops or murmurs. Lungs: clear Abdomen: soft, nontender, nondistended. No hepatosplenomegaly. No bruits or masses. Good bowel sounds. Extremities: no cyanosis, clubbing, rash, edema Neuro: alert & orientedx3, cranial nerves grossly intact. moves all 4 extremities w/o difficulty. Affect pleasant  ASSESSMENT & PLAN:  1. Chronic systolic HF - Myoview  (11/22): EF 41%  No ischemia/infarct  - Echo (12/23): EF 25-30%  - Unclear etiology. Suspect HTN vs possible chemotherapy-induced. With DM2 and FHx she is at high risk for underlying CAD, but only mild coronary Ca on recent non-contrast chest CT  - cMRI (5/24): LVEF 37% RVEF 42% Minimal LGE. No infiltrative process. 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 Volume looks good.  - Continue Lasix  40 mg daily alternating with 20 qd - Continue Jardiance 10 mg daily. - Continue Entresto  24/26 bid - Continue carvedilol  6.25 mg bid - Continue spiro 12.5 mg daily - Repeat labs today. If scr going up can stop spiro  2.  HTN - Blood pressure well controlled. Continue current regimen.  3. CKD IV  -  Baseline Scr 1.7-2.1, most recently 2.54 on 05/21/23 - Continue Jardiance - Follows with Nephrology - Repeat labs today  Jules Oar, MD  9:29 AM

## 2024-02-13 NOTE — Patient Instructions (Signed)
 Great to see you today!!!  Medication Changes:  None, continue current medications  Lab Work:  Labs done today, your results will be available in MyChart, we will contact you for abnormal readings.  Special Instructions // Education:  Do the following things EVERYDAY: Weigh yourself in the morning before breakfast. Write it down and keep it in a log. Take your medicines as prescribed Eat low salt foods--Limit salt (sodium) to 2000 mg per day.  Stay as active as you can everyday Limit all fluids for the day to less than 2 liters   Follow-Up in: 9 months    At the [facility], you and your health needs are our priority. We have a designated team specialized in the treatment of Heart Failure. This Care Team includes your primary Heart Failure Specialized Cardiologist (physician), Advanced Practice Providers (APPs- Physician Assistants and Nurse Practitioners), and Pharmacist who all work together to provide you with the care you need, when you need it.   You may see any of the following providers on your designated Care Team at your next follow up:  Dr. Jules Oar Dr. Peder Bourdon Dr. Alwin Baars Dr. Judyth Nunnery Nieves Bars, NP Ruddy Corral, [place] [facility] [place], [place] Dennise Fitz, NP [place] Lee, NP Luster Salters, PharmD   Please be sure to bring in all your medications bottles to every appointment.   Need to Contact Us :  If you have any questions or concerns before your next appointment please send us  a message through [place] or call our office at [phone].    TO LEAVE A MESSAGE FOR THE NURSE SELECT OPTION 2, PLEASE LEAVE A MESSAGE INCLUDING: YOUR NAME DATE OF BIRTH CALL BACK NUMBER REASON FOR CALL**this is important as we prioritize the call backs  YOU WILL RECEIVE A CALL BACK THE SAME DAY AS LONG AS YOU CALL BEFORE 4:00 PM

## 2024-02-13 NOTE — Addendum Note (Signed)
 Encounter addended by: Glorietta Lark, RN on: 02/13/2024 10:08 AM  Actions taken: Order list changed, Diagnosis association updated, Clinical Note Signed, Charge Capture section accepted

## 2024-02-26 ENCOUNTER — Other Ambulatory Visit: Payer: Self-pay | Admitting: Internal Medicine

## 2024-03-17 ENCOUNTER — Other Ambulatory Visit (HOSPITAL_COMMUNITY): Payer: Self-pay | Admitting: Internal Medicine

## 2024-03-30 ENCOUNTER — Other Ambulatory Visit: Payer: Self-pay | Admitting: Internal Medicine

## 2024-05-06 ENCOUNTER — Other Ambulatory Visit: Payer: Self-pay | Admitting: Internal Medicine

## 2024-05-06 DIAGNOSIS — Z1231 Encounter for screening mammogram for malignant neoplasm of breast: Secondary | ICD-10-CM

## 2024-05-28 ENCOUNTER — Ambulatory Visit
Admission: RE | Admit: 2024-05-28 | Discharge: 2024-05-28 | Disposition: A | Discharge status: Home / Self Care | Source: Ambulatory Visit | Attending: Internal Medicine | Admitting: Internal Medicine

## 2024-05-28 DIAGNOSIS — Z1231 Encounter for screening mammogram for malignant neoplasm of breast: Secondary | ICD-10-CM

## 2024-06-02 ENCOUNTER — Other Ambulatory Visit (HOSPITAL_BASED_OUTPATIENT_CLINIC_OR_DEPARTMENT_OTHER): Payer: Self-pay | Admitting: Internal Medicine

## 2024-06-02 DIAGNOSIS — Z780 Asymptomatic menopausal state: Secondary | ICD-10-CM

## 2024-06-11 ENCOUNTER — Other Ambulatory Visit (HOSPITAL_COMMUNITY): Payer: Self-pay

## 2024-06-15 ENCOUNTER — Other Ambulatory Visit (HOSPITAL_COMMUNITY): Payer: Self-pay | Admitting: Internal Medicine

## 2024-07-27 ENCOUNTER — Other Ambulatory Visit (HOSPITAL_COMMUNITY): Payer: Self-pay | Admitting: Family Medicine

## 2024-09-14 ENCOUNTER — Other Ambulatory Visit (HOSPITAL_COMMUNITY): Payer: Self-pay | Admitting: Internal Medicine

## 2024-10-06 ENCOUNTER — Other Ambulatory Visit (HOSPITAL_COMMUNITY): Payer: Self-pay

## 2024-10-10 ENCOUNTER — Other Ambulatory Visit (HOSPITAL_COMMUNITY): Payer: Self-pay

## 2024-10-10 ENCOUNTER — Telehealth (HOSPITAL_COMMUNITY): Payer: Self-pay

## 2024-10-10 ENCOUNTER — Other Ambulatory Visit (HOSPITAL_BASED_OUTPATIENT_CLINIC_OR_DEPARTMENT_OTHER)

## 2024-10-10 NOTE — Telephone Encounter (Signed)
 Advanced Heart Failure Patient Advocate Encounter  Test billing for this patient's current coverage ([employer]) returns a $30 copay for 90 day supply of Sacubitril -Valsartan . The patients copay for 30 days would be $15. Advised patient to fill for 90 days with pharmacy.  This test claim was processed through [facility]- copay amounts may vary at other pharmacies due to pharmacy/plan contracts, or as the patient moves through the different stages of their insurance plan.  Rachel DEL, CPhT Rx Patient Advocate Phone: [phone]

## 2024-11-14 ENCOUNTER — Encounter (HOSPITAL_COMMUNITY)

## 2024-11-20 ENCOUNTER — Ambulatory Visit (HOSPITAL_COMMUNITY)

## 2025-03-10 ENCOUNTER — Other Ambulatory Visit (HOSPITAL_BASED_OUTPATIENT_CLINIC_OR_DEPARTMENT_OTHER)
